# Patient Record
Sex: Female | Born: 1965 | Race: White | Hispanic: Yes | Marital: Married | Smoking: Never smoker
Health system: Southern US, Community
[De-identification: ages and names within clinical notes are randomized; demographics above are authoritative.]

## PROBLEM LIST (undated history)

## (undated) HISTORY — PX: CARPAL TUNNEL RELEASE: SHX101

---

## 2004-06-09 ENCOUNTER — Other Ambulatory Visit: Admission: RE | Admit: 2004-06-09 | Discharge: 2004-06-09 | Payer: Self-pay | Admitting: Obstetrics and Gynecology

## 2004-10-07 ENCOUNTER — Inpatient Hospital Stay (HOSPITAL_COMMUNITY): Admission: AD | Admit: 2004-10-07 | Discharge: 2004-10-10 | Payer: Self-pay | Admitting: Obstetrics and Gynecology

## 2004-11-17 ENCOUNTER — Other Ambulatory Visit: Admission: RE | Admit: 2004-11-17 | Discharge: 2004-11-17 | Payer: Self-pay | Admitting: Obstetrics and Gynecology

## 2006-02-19 ENCOUNTER — Other Ambulatory Visit: Admission: RE | Admit: 2006-02-19 | Discharge: 2006-02-19 | Payer: Self-pay | Admitting: Obstetrics and Gynecology

## 2006-06-12 ENCOUNTER — Inpatient Hospital Stay (HOSPITAL_COMMUNITY): Admission: AD | Admit: 2006-06-12 | Discharge: 2006-06-12 | Payer: Self-pay | Admitting: Obstetrics & Gynecology

## 2006-07-15 ENCOUNTER — Inpatient Hospital Stay (HOSPITAL_COMMUNITY): Admission: RE | Admit: 2006-07-15 | Discharge: 2006-07-17 | Payer: Self-pay | Admitting: Obstetrics & Gynecology

## 2018-10-05 ENCOUNTER — Other Ambulatory Visit: Payer: Self-pay

## 2018-10-05 ENCOUNTER — Encounter (HOSPITAL_COMMUNITY): Payer: Self-pay | Admitting: Obstetrics and Gynecology

## 2018-10-05 ENCOUNTER — Emergency Department (HOSPITAL_COMMUNITY): Payer: Self-pay

## 2018-10-05 ENCOUNTER — Emergency Department (HOSPITAL_COMMUNITY)
Admission: EM | Admit: 2018-10-05 | Discharge: 2018-10-05 | Disposition: A | Discharge status: Home / Self Care | Payer: Self-pay | Attending: Emergency Medicine | Admitting: Emergency Medicine

## 2018-10-05 DIAGNOSIS — R10817 Generalized abdominal tenderness: Secondary | ICD-10-CM | POA: Insufficient documentation

## 2018-10-05 DIAGNOSIS — R1010 Upper abdominal pain, unspecified: Secondary | ICD-10-CM

## 2018-10-05 DIAGNOSIS — R1011 Right upper quadrant pain: Secondary | ICD-10-CM | POA: Insufficient documentation

## 2018-10-05 LAB — COMPREHENSIVE METABOLIC PANEL
ALK PHOS: 74 U/L (ref 38–126)
ALT: 17 U/L (ref 0–44)
ANION GAP: 9 (ref 5–15)
AST: 20 U/L (ref 15–41)
Albumin: 4.3 g/dL (ref 3.5–5.0)
BILIRUBIN TOTAL: 0.6 mg/dL (ref 0.3–1.2)
BUN: 11 mg/dL (ref 6–20)
CALCIUM: 9.6 mg/dL (ref 8.9–10.3)
CO2: 26 mmol/L (ref 22–32)
CREATININE: 0.63 mg/dL (ref 0.44–1.00)
Chloride: 104 mmol/L (ref 98–111)
Glucose, Bld: 112 mg/dL — ABNORMAL HIGH (ref 70–99)
Potassium: 3.8 mmol/L (ref 3.5–5.1)
SODIUM: 139 mmol/L (ref 135–145)
TOTAL PROTEIN: 7.8 g/dL (ref 6.5–8.1)

## 2018-10-05 LAB — CBC
HCT: 38.6 % (ref 36.0–46.0)
Hemoglobin: 13 g/dL (ref 12.0–15.0)
MCH: 30.3 pg (ref 26.0–34.0)
MCHC: 33.7 g/dL (ref 30.0–36.0)
MCV: 90 fL (ref 80.0–100.0)
NRBC: 0 % (ref 0.0–0.2)
PLATELETS: 323 10*3/uL (ref 150–400)
RBC: 4.29 MIL/uL (ref 3.87–5.11)
RDW: 12.5 % (ref 11.5–15.5)
WBC: 8.6 10*3/uL (ref 4.0–10.5)

## 2018-10-05 LAB — I-STAT BETA HCG BLOOD, ED (MC, WL, AP ONLY)

## 2018-10-05 LAB — URINALYSIS, ROUTINE W REFLEX MICROSCOPIC
Bilirubin Urine: NEGATIVE
Glucose, UA: NEGATIVE mg/dL
HGB URINE DIPSTICK: NEGATIVE
KETONES UR: 20 mg/dL — AB
LEUKOCYTES UA: NEGATIVE
Nitrite: NEGATIVE
PROTEIN: NEGATIVE mg/dL
Specific Gravity, Urine: 1.019 (ref 1.005–1.030)
pH: 6 (ref 5.0–8.0)

## 2018-10-05 LAB — LIPASE, BLOOD: Lipase: 33 U/L (ref 11–51)

## 2018-10-05 MED ORDER — HYDROMORPHONE HCL 1 MG/ML IJ SOLN
0.5000 mg | Freq: Once | INTRAMUSCULAR | Status: AC
  Administered 2018-10-05: 0.5 mg via INTRAVENOUS
  Filled 2018-10-05: qty 1

## 2018-10-05 MED ORDER — DICYCLOMINE HCL 10 MG/ML IM SOLN
20.0000 mg | Freq: Once | INTRAMUSCULAR | Status: AC
  Administered 2018-10-05: 20 mg via INTRAMUSCULAR
  Filled 2018-10-05: qty 2

## 2018-10-05 MED ORDER — DICYCLOMINE HCL 20 MG PO TABS: 20.0000 mg | ORAL_TABLET | Freq: Two times a day (BID) | ORAL | 0 refills | Status: AC

## 2018-10-05 NOTE — ED Provider Notes (Signed)
[facility]-EMERGENCY DEPT Provider Note   CSN: [id number] Arrival date & time: 10/05/18  1815     History   Chief Complaint Chief Complaint  Patient presents with  . Abdominal Pain    HPI Brandi Crawford is a 52 y.o. female.  52 y/o female with no PMH presents to the ED with a chief complaint of abdominal pain x 6 months. Patient describes the pain as intermittent sharp mainly on the RUQ radiating to the middle of her abdomen. She reports the pain is worse with movement and after meals. Patient reports drinking tea for symptomatic relieve but reports no improvement. She has not tried any medication for symptoms. Patient states the symptoms worsen yesterday. She reports some constipation but had a bowel movement yesterday, no diarrhea. She denies any fever, nausea, vomiting, chest pain or urinary symptoms.      History reviewed. No pertinent past medical history.  There are no active problems to display for this patient.   Past Surgical History:  Procedure Laterality Date  . CARPAL TUNNEL RELEASE Left      OB History    Gravida      Para      Term      Preterm      AB      Living  2     SAB      TAB      Ectopic      Multiple      Live Births               Home Medications    Prior to Admission medications   Medication Sig Start Date End Date Taking? Authorizing Provider  Multiple Vitamin (MULTIVITAMIN WITH MINERALS) TABS tablet Take 1 tablet by mouth daily.   Yes [provider]  dicyclomine (BENTYL) 20 MG tablet Take 1 tablet (20 mg total) by mouth 2 (two) times daily for 14 days. 10/05/18 10/19/18  Claude MangesSoto, Jia Dottavio, PA-C    Family History No family history on file.  Social History Social History   Tobacco Use  . Smoking status: Never Smoker  Substance Use Topics  . Alcohol use: Not Currently  . Drug use: Not Currently     Allergies   Contrast media [iodinated diagnostic agents]   Review of  Systems Review of Systems  Constitutional: Negative for fever.  HENT: Negative for sore throat.   Respiratory: Negative for shortness of breath.   Cardiovascular: Negative for chest pain.  Gastrointestinal: Positive for abdominal pain. Negative for blood in stool, constipation, diarrhea, nausea and vomiting.  Genitourinary: Negative for dysuria, flank pain, hematuria, vaginal bleeding and vaginal discharge.  Skin: Negative for pallor and wound.  Neurological: Negative for light-headedness and headaches.     Physical Exam Updated Vital Signs BP (!) 148/76 (BP Location: Left Arm)   Pulse 61   Temp 98.8 F (37.1 C) (Oral)   Resp 17   LMP 10/05/2017   SpO2 100%   Physical Exam  Constitutional: She is oriented to person, place, and time. She appears well-developed and well-nourished.  HENT:  Head: Normocephalic and atraumatic.  Cardiovascular: Normal rate.  Pulmonary/Chest: Effort normal. No respiratory distress. She has no wheezes.  Abdominal: Soft. Bowel sounds are normal. There is generalized tenderness and tenderness in the right upper quadrant. There is guarding. There is no rigidity, no rebound and no CVA tenderness.  Neurological: She is alert and oriented to person, place, and time.  Skin: Skin is warm and dry.  Nursing note and vitals reviewed.    ED Treatments / Results  Labs (all labs ordered are listed, but only abnormal results are displayed) Labs Reviewed  COMPREHENSIVE METABOLIC PANEL - Abnormal; Notable for the following components:      Result Value   Glucose, Bld 112 (*)    All other components within normal limits  URINALYSIS, ROUTINE W REFLEX MICROSCOPIC - Abnormal; Notable for the following components:   APPearance HAZY (*)    Ketones, ur 20 (*)    All other components within normal limits  LIPASE, BLOOD  CBC  I-STAT BETA HCG BLOOD, ED ([facility], [facility], [facility] ONLY)    EKG None  Radiology US Abdomen Limited  Result Date: 10/05/2018 CLINICAL DATA:  RIGHT  upper quadrant pain with nausea and vomiting intermittently for 6 months EXAM: ULTRASOUND ABDOMEN LIMITED RIGHT UPPER QUADRANT COMPARISON:  None FINDINGS: Gallbladder: Shadowing 27 mm calculus within gallbladder, non mobile at lower gallbladder segment. Normal gallbladder wall thickness. No pericholecystic fluid. Sonographic Murphy sign present. Focus of ring down artifact seen from the anterior gallbladder wall question adenomyosis. Common bile duct: Diameter: 5 mm diameter, normal Liver: Normal echogenicity. No focal hepatic mass lesion or nodularity. Portal vein is patent on color Doppler imaging with normal direction of blood flow towards the liver. No RIGHT upper quadrant free fluid. IMPRESSION: 27 mm diameter non mobile calculus within gallbladder at the lower gallbladder segment. Patient demonstrated a sonographic Murphy sign with focal transducer pressure over the gallbladder though no gallbladder wall thickening or pericholecystic fluid are identified to support a sonographic diagnosis of acute cholecystitis; recommend correlation with clinical exam and laboratory data. No biliary dilatation or focal hepatic abnormalities. Electronically Signed   By: Ulyses Southward M.D.   On: 10/05/2018 20:10    Procedures Procedures (including critical care time)  Medications Ordered in ED Medications  dicyclomine (BENTYL) injection 20 mg (20 mg Intramuscular Given 10/05/18 1920)  HYDROmorphone (DILAUDID) injection 0.5 mg (0.5 mg Intravenous Given 10/05/18 2054)     Initial Impression / Assessment and Plan / ED Course  I have reviewed the triage vital signs and the nursing notes.  Pertinent labs & imaging results that were available during my care of the patient were reviewed by me and considered in my medical decision making (see chart for details).   Presents with upper abdominal pain x6 months.  Patient has not been seen or evaluated by any physician.  She has been taking tea for pain relief but states no  improvement in symptoms.  There is significant amount of right upper quadrant tenderness, will order right upper quadrant ultrasound to rule out any gallbladder pathology.  BMP showed no electrolyte abnormality, creatinine stable, LFTs are within normal limits, bilirubin is normal at 0.6.  CBC showed no leukocytosis, hemoglobin is also stable.  Suspicion for appendicitis, as patient has been tolerating food no nausea and is afebrile during visit.  UA showed no nitrite, leukocytes, white blood cell count patient denies urinary symptoms at this time. RUQ US showed: 27 mm diameter non mobile calculus within gallbladder at the lower  gallbladder segment.    Patient demonstrated a sonographic Murphy sign with focal transducer  pressure over the gallbladder though no gallbladder wall thickening  or pericholecystic fluid are identified to support a sonographic  diagnosis of acute cholecystitis; recommend correlation with  clinical exam and laboratory data.    No biliary dilatation or focal hepatic abnormalities.   Patient reports her pain has improved with bentyl and point  5 of dilaudid. Will have patient follow up with PCP, currently does not have a primary care physician will give a referral to the [facility] health and wellness clinic.  We will also provide her with some Bentyl prescription to help with her pain.  Is advised to return to the ED if she experiences any fever, chills, worsening symptoms.  Vitals stable during ED visit, patient is ambulatory and well-appearing.  Patient stable for discharge.    Final Clinical Impressions(s) / ED Diagnoses   Final diagnoses:  Pain of upper abdomen    ED Discharge Orders         Ordered    dicyclomine (BENTYL) 20 MG tablet  2 times daily     10/05/18 2116           Claude Manges, PA-C 10/05/18 2119    Pricilla Loveless, MD 10/05/18 [phone]

## 2018-10-05 NOTE — ED Notes (Signed)
US at bedside

## 2018-10-05 NOTE — ED Triage Notes (Signed)
Pt reports stomach pain that has been increasingly worse today. Pt reports nothing makes the pain better or worse and she had 1 episode of emesis. Patient denies N/D.

## 2018-10-05 NOTE — Discharge Instructions (Addendum)
Le he dado [place] receta con medicina para Chief Technology Officerel dolor, porfavor tome ConAgra Foodsuna pastilla dos veces al dia por los siguientes 7 dias. Tambien le he dado Environmental education officerel numero de [facility] porfavor hago una cita con ellos para conseguir un doctor primario. Si tiene fiebre, vomito o sus simptomas empeoran regrese a Radio broadcast assistantla sala de emergencia.

## 2020-05-11 IMAGING — US US ABDOMEN LIMITED
1 series · 13 of 25 positions shown · non-contrast
Comparison: None

CLINICAL DATA: RIGHT upper quadrant pain with nausea and vomiting
intermittently for 6 months

EXAM:
ULTRASOUND ABDOMEN LIMITED RIGHT UPPER QUADRANT

[Series 1: us abdomen limited · 13 of 52 slices shown]
[im 1/52]
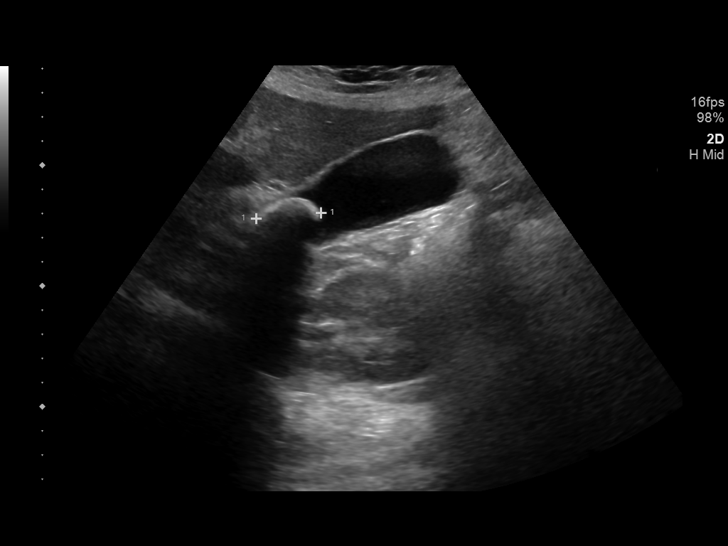
[im 5/52]
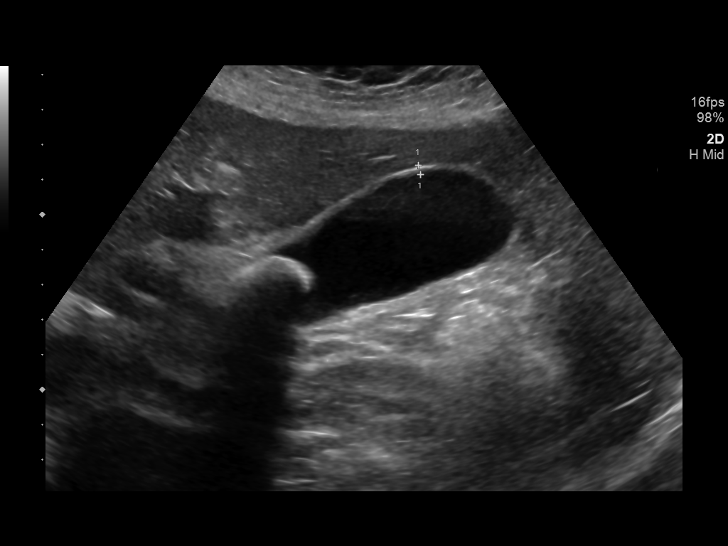
[im 9/52]
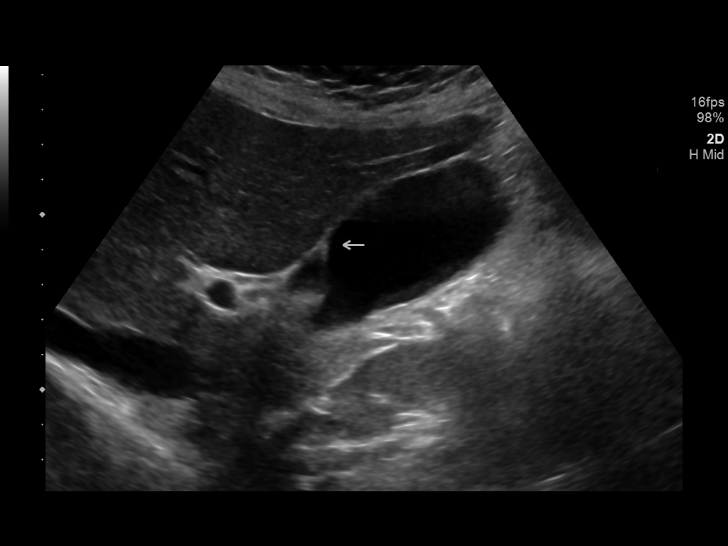
[im 13/52]
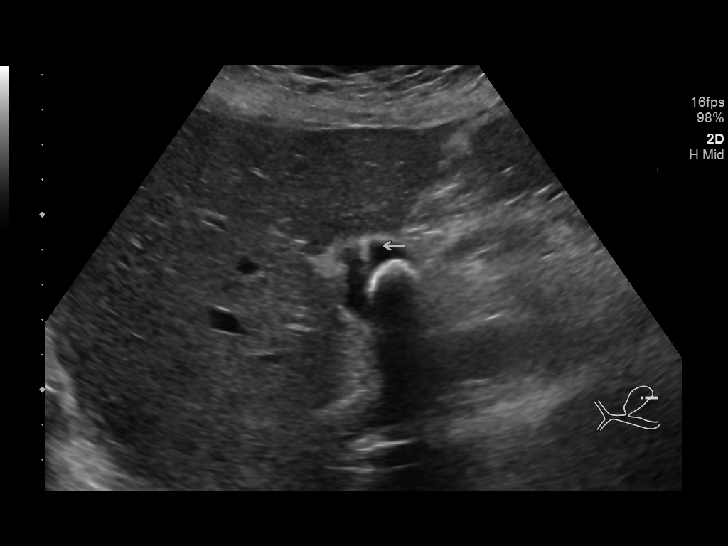
[im 18/52]
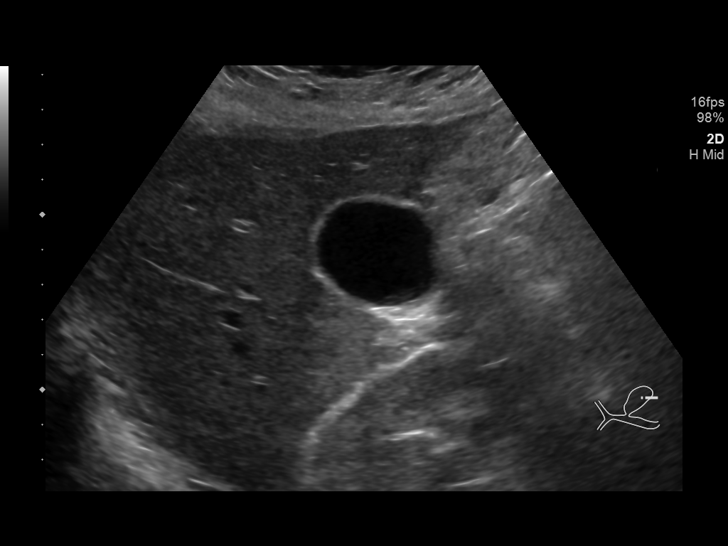
[im 22/52]
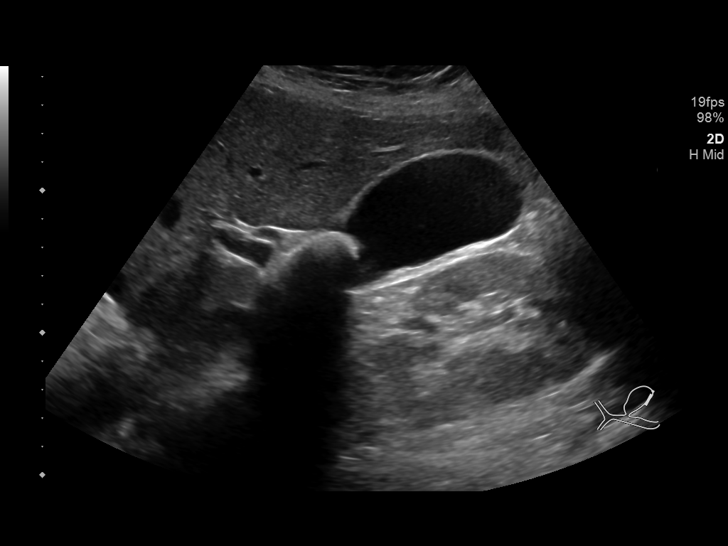
[im 26/52]
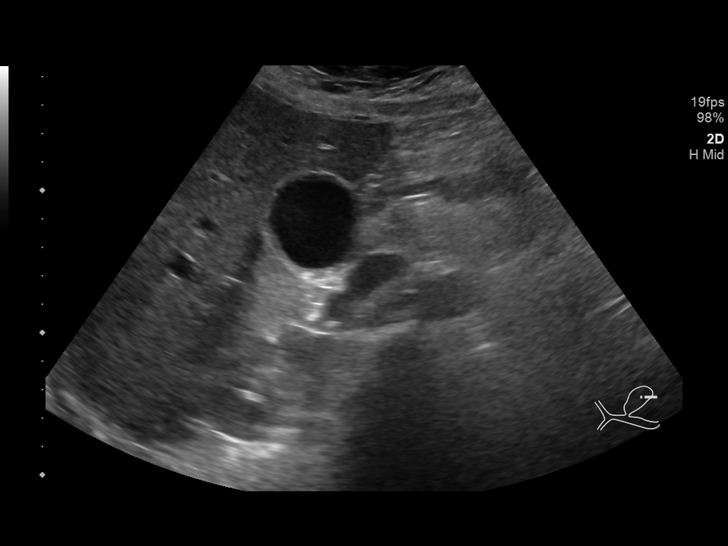
[im 30/52]
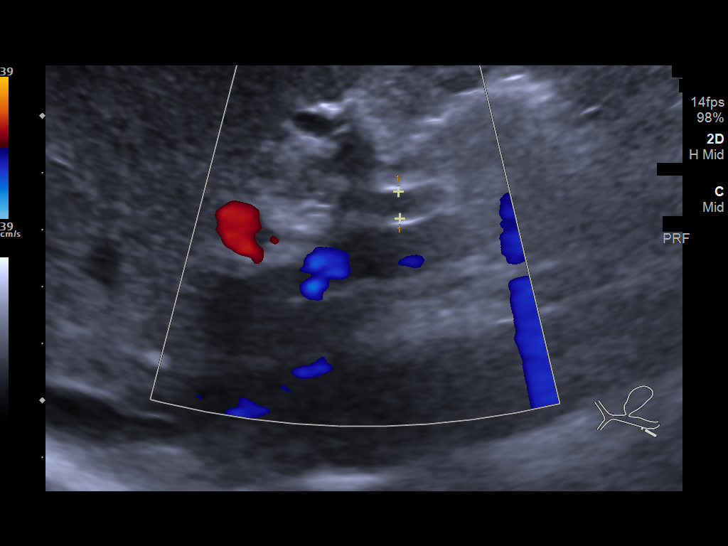
[im 35/52]
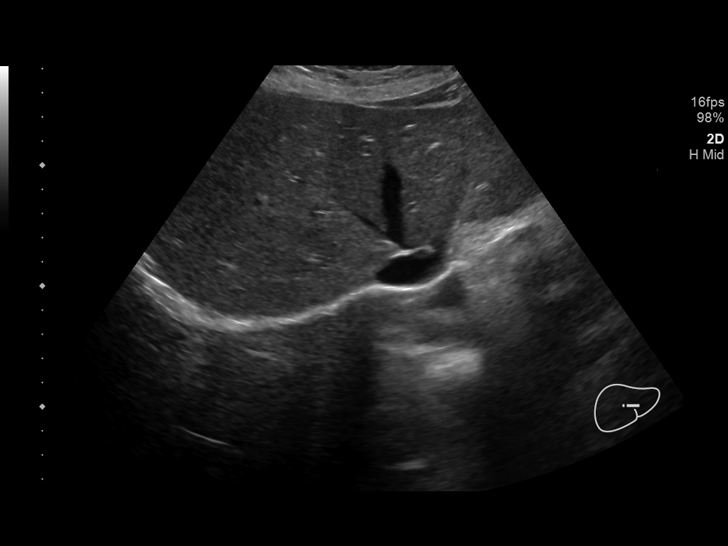
[im 39/52]
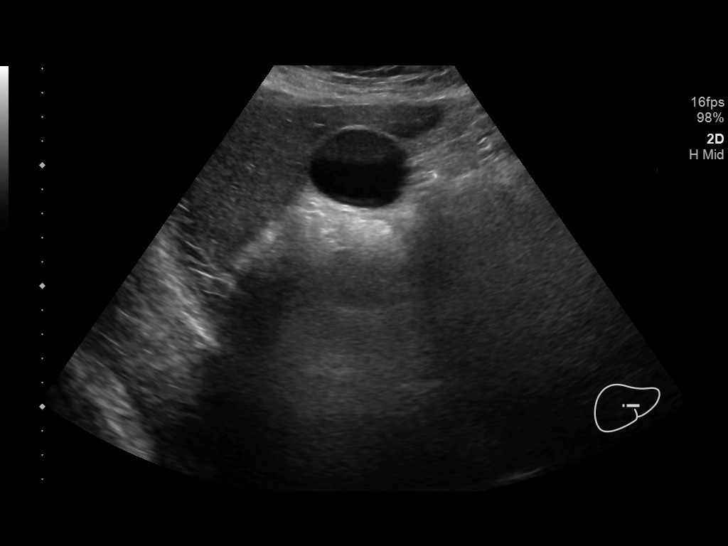
[im 43/52]
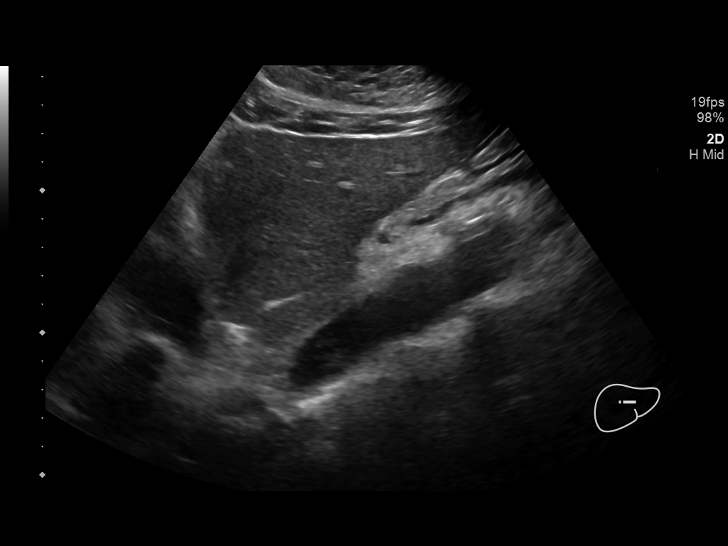
[im 47/52]
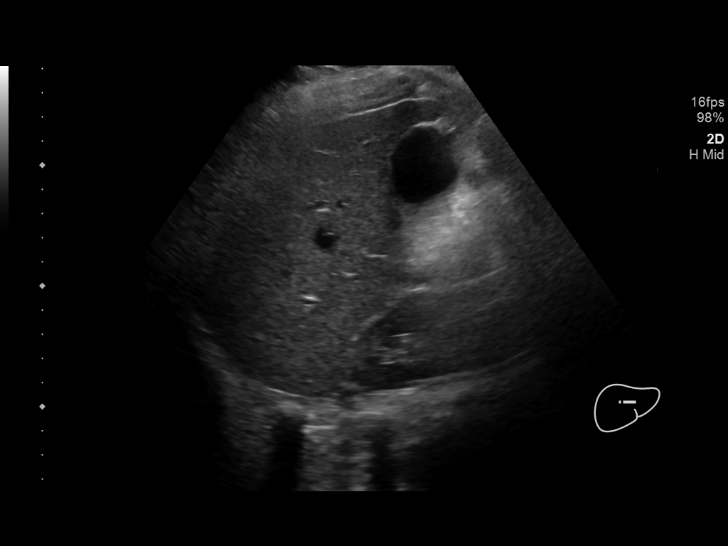
[im 52/52]
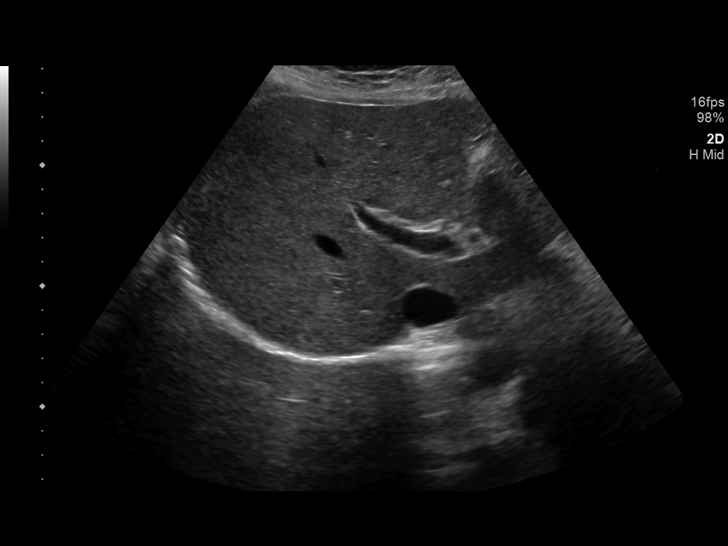

[13 of 25 positions shown; findings below may reference images not displayed]

FINDINGS: Gallbladder:

Shadowing 27 mm calculus within gallbladder, non mobile at lower
gallbladder segment. Normal gallbladder wall thickness. No
pericholecystic fluid. Sonographic Murphy sign present. Focus of
ring down artifact seen from the anterior gallbladder wall question
adenomyosis.

Common bile duct:

Diameter: 5 mm diameter, normal

Liver:

Normal echogenicity. No focal hepatic mass lesion or nodularity.
Portal vein is patent on color Doppler imaging with normal direction
of blood flow towards the liver.

No RIGHT upper quadrant free fluid.
IMPRESSION: 27 mm diameter non mobile calculus within gallbladder at the lower
gallbladder segment.

Patient demonstrated a sonographic Murphy sign with focal transducer
pressure over the gallbladder though no gallbladder wall thickening
or pericholecystic fluid are identified to support a sonographic
diagnosis of acute cholecystitis; recommend correlation with
clinical exam and laboratory data.

No biliary dilatation or focal hepatic abnormalities.

## 2021-03-21 ENCOUNTER — Ambulatory Visit: Payer: Self-pay | Admitting: Medical

## 2021-06-04 ENCOUNTER — Encounter: Payer: Self-pay | Admitting: Medical

## 2021-06-04 ENCOUNTER — Ambulatory Visit (INDEPENDENT_AMBULATORY_CARE_PROVIDER_SITE_OTHER): Payer: 59 | Admitting: Medical

## 2021-06-04 ENCOUNTER — Other Ambulatory Visit: Payer: Self-pay

## 2021-06-04 VITALS — BP 125/72 | HR 77 | Resp 18 | Ht 63.0 in | Wt 188.8 lb

## 2021-06-04 DIAGNOSIS — Z113 Encounter for screening for infections with a predominantly sexual mode of transmission: Secondary | ICD-10-CM

## 2021-06-04 DIAGNOSIS — Z0000 Encounter for general adult medical examination without abnormal findings: Secondary | ICD-10-CM | POA: Diagnosis not present

## 2021-06-04 DIAGNOSIS — Z1211 Encounter for screening for malignant neoplasm of colon: Secondary | ICD-10-CM | POA: Diagnosis not present

## 2021-06-04 DIAGNOSIS — Z1231 Encounter for screening mammogram for malignant neoplasm of breast: Secondary | ICD-10-CM | POA: Diagnosis not present

## 2021-06-04 NOTE — Patient Instructions (Addendum)
For you wellness exam today I have ordered cbc, cmp, lipid panel  and hiv.  Declined vaccines today.  Recommend exercise and healthy diet.  We will let you know lab results as they come in.  Follow up date appointment will be determined after lab review.    Refer to GI for colonoscopy.  Referral for mammogram placed. Explained process.    Cuidados preventivos en las mujeres de 55 a 83 aos de edad Preventive Care 18-53 Years Old, Female Los cuidados preventivos hacen referencia a las opciones en cuanto al estilo de vida y a las visitas al mdico, las cuales pueden promover la salud y Musician. Esto puede comprender lo siguiente: Un examen fsico anual. Esto tambin se conoce como visita de control de bienestar anual. Exmenes dentales y oculares de [place] regular. Vacunas. Estudios para Health and safety inspector. Elecciones para un estilo de vida saludable, por ejemplo: Seguir una dieta saludable. Practicar actividad fsica con regularidad. No consumir drogas ni productos que contengan nicotina y tabaco. Limitar el consumo de bebidas alcohlicas. Qu puedo esperar para mi visita de cuidado preventivo? Examen fsico El mdico revisar lo siguiente: IT consultant y [place]. Estos pueden usarse para calcular el IMC (ndice de masa corporal). El [facility] es una medicin que indica si tiene un peso saludable. Frecuencia cardaca y presin arterial. Temperatura corporal. Piel para detectar manchas anormales. Asesoramiento Su mdico puede preguntarle acerca de: Problemas mdicos pasados. Antecedentes mdicos familiares. Consumo de tabaco, alcohol y drogas. Su bienestar emocional. Restaurant manager, fast food y las relaciones personales. Su actividad sexual. Hbitos de alimentacin, ejercicio y sueo. Su trabajo y [place] laboral. Acceso a armas de fuego. Mtodos anticonceptivos. Ciclo menstrual. Antecedentes de embarazo. Qu vacunas necesito?  Las vacunas se aplican a varias edades,  segn un calendario. El [employer] recomendar vacunas segn su edad, sus antecedentes mdicos, su estilo de viday otros factores, como los viajes o el lugar donde trabaja. Qu pruebas necesito? Anlisis de [place] de lpidos y colesterol. Estos se pueden verificar cada 5 aos, o ms a menudo, si usted tiene ms de 32 aos de edad. Anlisis de hepatitis C. Anlisis de hepatitis B. Pruebas de deteccin Pruebas de deteccin de cncer de pulmn. Es posible que se le realice esta prueba de deteccin a partir de los 35 aos de edad, si ha fumado durante 30 aos un paquete diario y sigue fumando o dej el hbito en algn momento en los ltimos 15 aos. Pruebas de Programme researcher, broadcasting/film/video de Surveyor, minerals. Todos los adultos a partir de los 66 aos de edad y [place] 10 aos de edad deben hacerse esta prueba de deteccin. El mdico puede recomendarle las pruebas de deteccin a partir de los 13 aos de edad si corre un mayor riesgo. Le realizarn pruebas cada 1 a 10 aos, segn los [place] y el tipo de prueba de Programme researcher, broadcasting/film/video. Pruebas de deteccin de la diabetes. Esto se Set designer un control del azcar en la sangre (glucosa) despus de no haber comido durante un periodo de tiempo (ayuno). Es posible que se le realice esta prueba cada 1 a 3 aos. Mamografa. Se puede realizar cada 1 o 2 aos. Hable con su mdico sobre cundo debe comenzar a Engineer, manufacturing de [place] regular. Esto depende de si tiene antecedentes familiares de cncer de mama o no. Pruebas de deteccin de cncer relacionado con las mutaciones del BRCA. Es posible que se las deba realizar si tiene antecedentes de cncer de mama, de ovario, de trompas o peritoneal. Examen  plvico y prueba de Papanicolaou. Esto se puede Optometrist cada 3 aos a partir de los 21 aos de [place]. A partir de los 30 aos, esto se puede Optometrist cada 5 aos si usted se realiza una prueba de Papanicolaou en combinacin con una prueba de deteccin del virus  del papiloma humano (VPH). Otras pruebas Pruebas de enfermedades de transmisin sexual (ETS), si est en riesgo. Densitometra sea. Esto se realiza para detectar osteoporosis. Se le puede realizar este examen de deteccin si tiene un riesgo alto de tener osteoporosis. Hable con su mdico [employer], las opciones detratamiento y, si corresponde, la necesidad de Optometrist ms pruebas. Siga estas instrucciones en su casa: Comida y bebida  Siga una dieta que incluya frutas y verduras frescas, cereales integrales, protenas magras y productos lcteos descremados. Tome los suplementos vitamnicos y [employer] se lo haya indicado el mdico. No beba alcohol si: Su mdico le indica no hacerlo. Est embarazada, puede estar embarazada o est tratando de [place]. Si bebe alcohol: Limite la cantidad que consume de 0 a 1 medida por da. Est atenta a la cantidad de alcohol que hay en las bebidas que toma. En los [place], una medida equivale a una botella de cerveza de 12 oz (355 ml), un vaso de vino de 5 oz (148 ml) o un vaso de una bebida alcohlica de alta graduacin de 1 oz (44 ml).  Estilo de [employer] y las encas a diario. Cepllese los dientes a la maana y a la noche con pasta dental con fluoruro. Use hilo dental una vez al da. Mantngase activa. Haga al menos 30 minutos de ejercicio, 5 o ms das cada semana. No consuma ningn producto que contenga nicotina o tabaco, como cigarrillos, cigarrillos electrnicos y tabaco de Higher education careers adviser. Si necesita ayuda para dejar de fumar, consulte al mdico. No consuma drogas. Si es sexualmente activa, practique sexo seguro. Use un condn u otra forma de proteccin para prevenir las ITS (infecciones de transmisin sexual). Si no desea quedar embarazada, use un mtodo anticonceptivo. Si busca un embarazo, realice una consulta previa al [employer] con el mdico. Si el mdico se lo indic, tome una dosis baja de  aspirina diariamente a partir de los 43 aos de [place]. Encuentre formas saludables de lidiar con el estrs tales como: Meditacin, yoga o Conservation officer, nature. Lleve un diario personal. Hable con una persona confiable. Pase tiempo con amigos y familiares. Seguridad [place] siempre el cinturn de seguridad al conducir o viajar en un vehculo. No conduzca: Si ha estado bebiendo alcohol. No viaje con un conductor que ha estado bebiendo. Si est cansada o distrada. Mientras est enviando mensajes de texto. Use un casco y otros equipos de proteccin [place] deportivas. Si tiene armas de fuego en su casa, asegrese de seguir todos los procedimientos de seguridad correspondientes. Cundo volver? Visite al mdico una vez al ao para una visita anual de control de bienestar. Pregntele al mdico con qu frecuencia debe realizarse un control de la vista y los dientes. Mantenga su esquema de vacunacin al da. Esta informacin no tiene Marine scientist el consejo del mdico. Asegresede hacerle al mdico cualquier pregunta que tenga. Document Revised: 08/28/2020 Document Reviewed: 08/28/2020 Elsevier Patient Education  2022 [employer].

## 2021-06-04 NOTE — Progress Notes (Signed)
Subjective:    Patient ID: Brandi Crawford, female    DOB: July 14, 1966, 55 y.o.   MRN: [id number]  HPI  Pt in for first time.  Pt went to clinic and never had pcp.   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 States moderate healthy diet. 2-3 cups of coffee a day. Occasional soda rare. Non smoker. No alcohol use.  Pt states last wellness exam about 2-3 years.   Pt never had covid vaccine. But states had a lot of symptoms first year of pandemic.   Pt never had colonoscopy.   Pt had last pap one year ago and was normal.     Review of Systems  Constitutional:  Negative for chills, fatigue and fever.  HENT:  Negative for congestion and ear discharge.   Respiratory:  Negative for cough, chest tightness and wheezing.   Cardiovascular:  Negative for chest pain and palpitations.  Gastrointestinal:  Negative for abdominal pain.  Endocrine: Negative for polydipsia, polyphagia and polyuria.  Genitourinary:  Negative for dysuria, frequency and hematuria.  Musculoskeletal:  Negative for back pain, myalgias and neck pain.  Skin:  Negative for rash.  Psychiatric/Behavioral:  Negative for behavioral problems, decreased concentration, dysphoric mood, sleep disturbance and suicidal ideas.     No past medical history on file.   Social History   Socioeconomic History   Marital status: Married    Spouse name: Not on file   Number of children: Not on file   Years of education: Not on file   Highest education level: Not on file  Occupational History   Not on file  Tobacco Use   Smoking status: Never   Smokeless tobacco: Not on file  Substance and Sexual Activity   Alcohol use: Not Currently   Drug use: Not Currently   Sexual activity: Yes  Other Topics Concern   Not on file  Social History Narrative   Not on file   Social Determinants of Health   Financial Resource Strain: Not on file  Food Insecurity: Not on file  Transportation Needs: Not on file  Physical  Activity: Not on file  Stress: Not on file  Social Connections: Not on file  Intimate Partner Violence: Not on file    Past Surgical History:  Procedure Laterality Date   CARPAL TUNNEL RELEASE Left     No family history on file.  Allergies  Allergen Reactions   Contrast Media [Iodinated Diagnostic Agents] Itching    Current Outpatient Medications on File Prior to Visit  Medication Sig Dispense Refill   Multiple Vitamin (MULTIVITAMIN WITH MINERALS) TABS tablet Take 1 tablet by mouth daily.     dicyclomine (BENTYL) 20 MG tablet Take 1 tablet (20 mg total) by mouth 2 (two) times daily for 14 days. 28 tablet 0   No current facility-administered medications on file prior to visit.    BP 125/72   Pulse 77   Resp 18   Ht 5\' 3"  (1.6 m)   Wt 188 lb 12.8 oz (85.6 kg)   LMP 10/05/2017   SpO2 98%   BMI 33.44 kg/m       Objective:   Physical Exam  General Mental Status- Alert. General Appearance- Not in acute distress.   Skin General: Color- Normal Color. Moisture- Normal Moisture.  Neck Carotid Arteries- Normal color. Moisture- Normal Moisture. No carotid bruits. No JVD.  Chest and Lung Exam Auscultation: Breath Sounds:-Normal.  Cardiovascular Auscultation:Rythm- Regular. Murmurs & Other Heart Sounds:Auscultation of the heart reveals-  No Murmurs.  Abdomen Inspection:-Inspeection Normal. Palpation/Percussion:Note:No mass. Palpation and Percussion of the abdomen reveal- Non Tender, Non Distended + BS, no rebound or guarding.  Neurologic Cranial Nerve exam:- CN III-XII intact(No nystagmus), symmetric smile. Strength:- 5/5 equal and symmetric strength both upper and lower extremities.       Assessment & Plan:   For you wellness exam today I have ordered cbc, cmp, lipid panel  and hiv.  Declined vaccines today.  Recommend exercise and healthy diet.  We will let you know lab results as they come in.  Follow up date appointment will be determined after lab  review.    Refer to GI for colonoscopy.  Referral for mammogram placed. Explained process.  Esperanza Richters, PA-C

## 2021-07-04 ENCOUNTER — Other Ambulatory Visit: Payer: Self-pay

## 2021-07-04 ENCOUNTER — Other Ambulatory Visit (INDEPENDENT_AMBULATORY_CARE_PROVIDER_SITE_OTHER): Payer: 59

## 2021-07-04 ENCOUNTER — Telehealth: Payer: Self-pay | Admitting: Medical

## 2021-07-04 DIAGNOSIS — Z113 Encounter for screening for infections with a predominantly sexual mode of transmission: Secondary | ICD-10-CM

## 2021-07-04 DIAGNOSIS — Z1231 Encounter for screening mammogram for malignant neoplasm of breast: Secondary | ICD-10-CM

## 2021-07-04 DIAGNOSIS — Z0000 Encounter for general adult medical examination without abnormal findings: Secondary | ICD-10-CM | POA: Diagnosis not present

## 2021-07-04 LAB — COMPREHENSIVE METABOLIC PANEL
ALT: 15 U/L (ref 0–35)
AST: 16 U/L (ref 0–37)
Albumin: 4 g/dL (ref 3.5–5.2)
Alkaline Phosphatase: 82 U/L (ref 39–117)
BUN: 12 mg/dL (ref 6–23)
CO2: 26 mEq/L (ref 19–32)
Calcium: 9.2 mg/dL (ref 8.4–10.5)
Chloride: 104 mEq/L (ref 96–112)
Creatinine, Ser: 0.6 mg/dL (ref 0.40–1.20)
GFR: 102.84 mL/min (ref >60.00)
Glucose, Bld: 90 mg/dL (ref 70–99)
Potassium: 4.5 mEq/L (ref 3.5–5.1)
Sodium: 138 mEq/L (ref 135–145)
Total Bilirubin: 0.4 mg/dL (ref 0.2–1.2)
Total Protein: 6.9 g/dL (ref 6.0–8.3)

## 2021-07-04 LAB — CBC WITH DIFFERENTIAL/PLATELET
Basophils Absolute: 0 10*3/uL (ref 0.0–0.1)
Basophils Relative: 0.5 % (ref 0.0–3.0)
Eosinophils Absolute: 0.1 10*3/uL (ref 0.0–0.7)
Eosinophils Relative: 1.6 % (ref 0.0–5.0)
HCT: 36.9 % (ref 36.0–46.0)
Hemoglobin: 12.5 g/dL (ref 12.0–15.0)
Lymphocytes Relative: 29.8 % (ref 12.0–46.0)
Lymphs Abs: 1.8 10*3/uL (ref 0.7–4.0)
MCHC: 34 g/dL (ref 30.0–36.0)
MCV: 86.9 fl (ref 78.0–100.0)
Monocytes Absolute: 0.4 10*3/uL (ref 0.1–1.0)
Monocytes Relative: 6.5 % (ref 3.0–12.0)
Neutro Abs: 3.8 10*3/uL (ref 1.4–7.7)
Neutrophils Relative %: 61.6 % (ref 43.0–77.0)
Platelets: 312 10*3/uL (ref 150.0–400.0)
RBC: 4.24 Mil/uL (ref 3.87–5.11)
RDW: 13 % (ref 11.5–15.5)
WBC: 6.2 10*3/uL (ref 4.0–10.5)

## 2021-07-04 LAB — LIPID PANEL
Cholesterol: 199 mg/dL (ref 0–200)
HDL: 42.9 mg/dL (ref >39.00)
LDL Cholesterol: 136 mg/dL — ABNORMAL HIGH (ref 0–99)
NonHDL: 155.8
Total CHOL/HDL Ratio: 5
Triglycerides: 99 mg/dL (ref 0.0–149.0)
VLDL: 19.8 mg/dL (ref 0.0–40.0)

## 2021-07-04 NOTE — Telephone Encounter (Signed)
Pt stated during her last visit asked provider to put an order for her to get a mammogram - please put order in for a mammogram so pt can schedule an appt. Please inform pt when order in at tel [phone]. Please advise.

## 2021-07-04 NOTE — Addendum Note (Signed)
Addended by: Gwenevere Abbot on: 07/04/2021 05:16 PM   Modules accepted: Orders

## 2021-07-06 LAB — HIV ANTIBODY (ROUTINE TESTING W REFLEX): HIV 1&2 Ab, 4th Generation: NONREACTIVE

## 2021-07-07 NOTE — Progress Notes (Signed)
Called but no answer and voice mail was full ?

## 2021-07-08 NOTE — Telephone Encounter (Signed)
Called pt could not leave message (mail box full) - wanted to inform pt mammogram order in and pt can schedule her mammogram appt.

## 2021-07-08 NOTE — Progress Notes (Signed)
Called a few times but no answer and mailbox was full.

## 2021-08-22 ENCOUNTER — Ambulatory Visit: Payer: 59 | Admitting: Family

## 2021-08-22 ENCOUNTER — Other Ambulatory Visit: Payer: Self-pay

## 2021-08-25 ENCOUNTER — Ambulatory Visit: Payer: 59 | Admitting: Medical

## 2021-09-09 ENCOUNTER — Ambulatory Visit (HOSPITAL_COMMUNITY)
Admission: EM | Admit: 2021-09-09 | Discharge: 2021-09-10 | Disposition: A | Discharge status: Home / Self Care | Payer: 59 | Attending: Surgery | Admitting: Surgery

## 2021-09-09 ENCOUNTER — Other Ambulatory Visit: Payer: Self-pay

## 2021-09-09 ENCOUNTER — Emergency Department (HOSPITAL_COMMUNITY): Payer: 59

## 2021-09-09 DIAGNOSIS — Z20822 Contact with and (suspected) exposure to covid-19: Secondary | ICD-10-CM | POA: Diagnosis not present

## 2021-09-09 DIAGNOSIS — Z91041 Radiographic dye allergy status: Secondary | ICD-10-CM | POA: Insufficient documentation

## 2021-09-09 DIAGNOSIS — R109 Unspecified abdominal pain: Secondary | ICD-10-CM

## 2021-09-09 DIAGNOSIS — R1011 Right upper quadrant pain: Secondary | ICD-10-CM | POA: Diagnosis present

## 2021-09-09 DIAGNOSIS — R1114 Bilious vomiting: Secondary | ICD-10-CM

## 2021-09-09 DIAGNOSIS — E860 Dehydration: Secondary | ICD-10-CM | POA: Diagnosis not present

## 2021-09-09 DIAGNOSIS — K8020 Calculus of gallbladder without cholecystitis without obstruction: Secondary | ICD-10-CM

## 2021-09-09 DIAGNOSIS — K8012 Calculus of gallbladder with acute and chronic cholecystitis without obstruction: Secondary | ICD-10-CM | POA: Diagnosis not present

## 2021-09-09 LAB — COMPREHENSIVE METABOLIC PANEL
ALT: 16 U/L (ref 0–44)
AST: 21 U/L (ref 15–41)
Albumin: 4.2 g/dL (ref 3.5–5.0)
Alkaline Phosphatase: 86 U/L (ref 38–126)
Anion gap: 7 (ref 5–15)
BUN: 7 mg/dL (ref 6–20)
CO2: 26 mmol/L (ref 22–32)
Calcium: 9.4 mg/dL (ref 8.9–10.3)
Chloride: 104 mmol/L (ref 98–111)
Creatinine, Ser: 0.55 mg/dL (ref 0.44–1.00)
GFR, Estimated: >60 mL/min (ref >60)
Glucose, Bld: 112 mg/dL — ABNORMAL HIGH (ref 70–99)
Potassium: 4 mmol/L (ref 3.5–5.1)
Sodium: 137 mmol/L (ref 135–145)
Total Bilirubin: 0.4 mg/dL (ref 0.3–1.2)
Total Protein: 7.7 g/dL (ref 6.5–8.1)

## 2021-09-09 LAB — CBC WITH DIFFERENTIAL/PLATELET
Abs Immature Granulocytes: 0.06 10*3/uL (ref 0.00–0.07)
Basophils Absolute: 0 10*3/uL (ref 0.0–0.1)
Basophils Relative: 0 %
Eosinophils Absolute: 0 10*3/uL (ref 0.0–0.5)
Eosinophils Relative: 0 %
HCT: 38.3 % (ref 36.0–46.0)
Hemoglobin: 12.7 g/dL (ref 12.0–15.0)
Immature Granulocytes: 1 %
Lymphocytes Relative: 12 %
Lymphs Abs: 1.5 10*3/uL (ref 0.7–4.0)
MCH: 29.5 pg (ref 26.0–34.0)
MCHC: 33.2 g/dL (ref 30.0–36.0)
MCV: 88.9 fL (ref 80.0–100.0)
Monocytes Absolute: 0.5 10*3/uL (ref 0.1–1.0)
Monocytes Relative: 4 %
Neutro Abs: 10.6 10*3/uL — ABNORMAL HIGH (ref 1.7–7.7)
Neutrophils Relative %: 83 %
Platelets: 343 10*3/uL (ref 150–400)
RBC: 4.31 MIL/uL (ref 3.87–5.11)
RDW: 12.6 % (ref 11.5–15.5)
WBC: 12.7 10*3/uL — ABNORMAL HIGH (ref 4.0–10.5)
nRBC: 0 % (ref 0.0–0.2)

## 2021-09-09 LAB — I-STAT BETA HCG BLOOD, ED (MC, WL, AP ONLY): I-stat hCG, quantitative: <5 m[IU]/mL (ref <5)

## 2021-09-09 LAB — URINALYSIS, ROUTINE W REFLEX MICROSCOPIC
Bilirubin Urine: NEGATIVE
Glucose, UA: NEGATIVE mg/dL
Hgb urine dipstick: NEGATIVE
Ketones, ur: 80 mg/dL — AB
Leukocytes,Ua: NEGATIVE
Nitrite: NEGATIVE
Protein, ur: NEGATIVE mg/dL
Specific Gravity, Urine: 1.018 (ref 1.005–1.030)
pH: 7 (ref 5.0–8.0)

## 2021-09-09 LAB — LIPASE, BLOOD: Lipase: 24 U/L (ref 11–51)

## 2021-09-09 MED ORDER — MORPHINE SULFATE (PF) 4 MG/ML IV SOLN
4.0000 mg | Freq: Once | INTRAVENOUS | Status: AC
  Administered 2021-09-09: 4 mg via INTRAMUSCULAR
  Filled 2021-09-09: qty 1

## 2021-09-09 MED ORDER — ONDANSETRON 8 MG PO TBDP
8.0000 mg | ORAL_TABLET | Freq: Once | ORAL | Status: AC
  Administered 2021-09-09: 8 mg via ORAL
  Filled 2021-09-09: qty 1

## 2021-09-09 NOTE — ED Provider Notes (Signed)
Emergency Medicine Provider Triage Evaluation Note  Brandi Crawford , a 55 y.o. female  was evaluated in triage.  Pt complains of right upper quadrant pain that began yesterday, exacerbated with food.  Prior episodes similar to this, where she was told she had stones in her gallbladder.  She did not have this removed.  Not taking any medication for improvement in her symptoms due to being afraid that this will likely exacerbate the pain.  N.p.o. since last night.  Review of Systems  Positive: Right upper quadrant pain Negative: Fever, diarrhea  Physical Exam  BP (!) 152/59 (BP Location: Right Arm)   Pulse (!) 58   Temp 98.8 F (37.1 C) (Oral)   Resp 15   Ht 5\' 3"  (1.6 m)   Wt 85.3 kg   LMP 10/05/2017   SpO2 99%   BMI 33.30 kg/m  Gen:   Awake, no distress   Resp:  Normal effort  MSK:   Moves extremities without difficulty  Other:    Medical Decision Making  Medically screening exam initiated at 7:26 PM.  Appropriate orders placed.  Brandi Crawford was informed that the remainder of the evaluation will be completed by another provider, this initial triage assessment does not replace that evaluation, and the importance of remaining in the ED until their evaluation is complete.  Patient here with right upper quadrant pain that began yesterday, exacerbated with food.  Prior history of cholelithiasis, never had cholecystectomy.  No medication trial prior to arrival.   Brandi Crawford, Claude Manges 09/09/21 13/01/22, MD 09/09/21 [id number]

## 2021-09-09 NOTE — ED Notes (Signed)
US at bedside

## 2021-09-09 NOTE — ED Triage Notes (Signed)
Pt has sharp RUQ pain since 1100 this morning.

## 2021-09-10 ENCOUNTER — Encounter (HOSPITAL_COMMUNITY): Payer: Self-pay | Admitting: *Deleted

## 2021-09-10 ENCOUNTER — Encounter (HOSPITAL_COMMUNITY)
Admission: EM | Disposition: A | Discharge status: Home / Self Care | Payer: Self-pay | Source: Home / Self Care | Attending: Emergency Medicine

## 2021-09-10 ENCOUNTER — Telehealth: Payer: Self-pay | Admitting: Medical

## 2021-09-10 ENCOUNTER — Emergency Department (HOSPITAL_COMMUNITY): Payer: 59 | Admitting: Certified Registered Nurse Anesthetist

## 2021-09-10 ENCOUNTER — Ambulatory Visit: Admit: 2021-09-10 | Payer: 59 | Admitting: Surgery

## 2021-09-10 DIAGNOSIS — K8012 Calculus of gallbladder with acute and chronic cholecystitis without obstruction: Secondary | ICD-10-CM | POA: Diagnosis not present

## 2021-09-10 DIAGNOSIS — Z20822 Contact with and (suspected) exposure to covid-19: Secondary | ICD-10-CM | POA: Diagnosis not present

## 2021-09-10 DIAGNOSIS — Z91041 Radiographic dye allergy status: Secondary | ICD-10-CM | POA: Diagnosis not present

## 2021-09-10 DIAGNOSIS — E860 Dehydration: Secondary | ICD-10-CM | POA: Diagnosis not present

## 2021-09-10 DIAGNOSIS — K8070 Calculus of gallbladder and bile duct without cholecystitis without obstruction: Secondary | ICD-10-CM

## 2021-09-10 HISTORY — PX: CHOLECYSTECTOMY: SHX55

## 2021-09-10 LAB — RESP PANEL BY RT-PCR (FLU A&B, COVID) ARPGX2
Influenza A by PCR: NEGATIVE
Influenza B by PCR: NEGATIVE
SARS Coronavirus 2 by RT PCR: NEGATIVE

## 2021-09-10 SURGERY — LAPAROSCOPIC CHOLECYSTECTOMY WITH INTRAOPERATIVE CHOLANGIOGRAM
Anesthesia: General | Site: Abdomen

## 2021-09-10 MED ORDER — FENTANYL CITRATE (PF) 100 MCG/2ML IJ SOLN
INTRAMUSCULAR | Status: AC
  Filled 2021-09-10: qty 2

## 2021-09-10 MED ORDER — LIDOCAINE 2% (20 MG/ML) 5 ML SYRINGE
INTRAMUSCULAR | Status: DC | PRN
  Administered 2021-09-10: 50 mg via INTRAVENOUS

## 2021-09-10 MED ORDER — ACETAMINOPHEN 160 MG/5ML PO SOLN: 325.0000 mg | ORAL | Status: DC | PRN

## 2021-09-10 MED ORDER — FENTANYL CITRATE (PF) 100 MCG/2ML IJ SOLN
INTRAMUSCULAR | Status: DC | PRN
  Administered 2021-09-10 (×4): 50 ug via INTRAVENOUS

## 2021-09-10 MED ORDER — HYDROMORPHONE HCL 1 MG/ML IJ SOLN
0.5000 mg | Freq: Once | INTRAMUSCULAR | Status: AC
  Administered 2021-09-10: 0.5 mg via INTRAVENOUS
  Filled 2021-09-10: qty 1

## 2021-09-10 MED ORDER — MIDAZOLAM HCL 5 MG/5ML IJ SOLN
INTRAMUSCULAR | Status: DC | PRN
  Administered 2021-09-10: 2 mg via INTRAVENOUS

## 2021-09-10 MED ORDER — PROPOFOL 10 MG/ML IV BOLUS
INTRAVENOUS | Status: AC
  Filled 2021-09-10: qty 20

## 2021-09-10 MED ORDER — SODIUM CHLORIDE 0.9 % IV BOLUS
1000.0000 mL | Freq: Once | INTRAVENOUS | Status: AC
  Administered 2021-09-10: 1000 mL via INTRAVENOUS

## 2021-09-10 MED ORDER — OXYCODONE HCL 5 MG PO TABS: 5.0000 mg | ORAL_TABLET | Freq: Four times a day (QID) | ORAL | 0 refills | Status: AC | PRN

## 2021-09-10 MED ORDER — LACTATED RINGERS IV SOLN: INTRAVENOUS | Status: DC

## 2021-09-10 MED ORDER — ROCURONIUM BROMIDE 10 MG/ML (PF) SYRINGE
PREFILLED_SYRINGE | INTRAVENOUS | Status: DC | PRN
  Administered 2021-09-10: 60 mg via INTRAVENOUS

## 2021-09-10 MED ORDER — FENTANYL CITRATE PF 50 MCG/ML IJ SOSY
PREFILLED_SYRINGE | INTRAMUSCULAR | Status: AC
  Filled 2021-09-10: qty 3

## 2021-09-10 MED ORDER — ONDANSETRON HCL 4 MG/2ML IJ SOLN
INTRAMUSCULAR | Status: AC
  Filled 2021-09-10: qty 2

## 2021-09-10 MED ORDER — AMISULPRIDE (ANTIEMETIC) 5 MG/2ML IV SOLN: 10.0000 mg | Freq: Once | INTRAVENOUS | Status: DC | PRN

## 2021-09-10 MED ORDER — OXYCODONE HCL 5 MG PO TABS: 5.0000 mg | ORAL_TABLET | Freq: Once | ORAL | Status: AC | PRN

## 2021-09-10 MED ORDER — PROPOFOL 10 MG/ML IV BOLUS
INTRAVENOUS | Status: DC | PRN
  Administered 2021-09-10: 150 mg via INTRAVENOUS

## 2021-09-10 MED ORDER — OXYCODONE HCL 5 MG/5ML PO SOLN: 5.0000 mg | Freq: Once | ORAL | Status: AC | PRN

## 2021-09-10 MED ORDER — DEXAMETHASONE SODIUM PHOSPHATE 10 MG/ML IJ SOLN
INTRAMUSCULAR | Status: AC
  Filled 2021-09-10: qty 1

## 2021-09-10 MED ORDER — DEXAMETHASONE SODIUM PHOSPHATE 4 MG/ML IJ SOLN
INTRAMUSCULAR | Status: DC | PRN
  Administered 2021-09-10: 10 mg via INTRAVENOUS

## 2021-09-10 MED ORDER — ROCURONIUM BROMIDE 10 MG/ML (PF) SYRINGE
PREFILLED_SYRINGE | INTRAVENOUS | Status: AC
  Filled 2021-09-10: qty 10

## 2021-09-10 MED ORDER — 0.9 % SODIUM CHLORIDE (POUR BTL) OPTIME
TOPICAL | Status: DC | PRN
  Administered 2021-09-10: 1000 mL

## 2021-09-10 MED ORDER — CEFAZOLIN SODIUM-DEXTROSE 2-4 GM/100ML-% IV SOLN
2.0000 g | Freq: Once | INTRAVENOUS | Status: DC
  Filled 2021-09-10: qty 100

## 2021-09-10 MED ORDER — MIDAZOLAM HCL 2 MG/2ML IJ SOLN
INTRAMUSCULAR | Status: AC
  Filled 2021-09-10: qty 2

## 2021-09-10 MED ORDER — OXYCODONE HCL 5 MG PO TABS: 5.0000 mg | ORAL_TABLET | Freq: Four times a day (QID) | ORAL | 0 refills | Status: DC | PRN

## 2021-09-10 MED ORDER — MORPHINE SULFATE (PF) 4 MG/ML IV SOLN
4.0000 mg | Freq: Once | INTRAVENOUS | Status: AC
  Administered 2021-09-10: 4 mg via INTRAVENOUS
  Filled 2021-09-10: qty 1

## 2021-09-10 MED ORDER — ACETAMINOPHEN 325 MG PO TABS: 325.0000 mg | ORAL_TABLET | ORAL | Status: DC | PRN

## 2021-09-10 MED ORDER — LACTATED RINGERS IR SOLN
Status: DC | PRN
  Administered 2021-09-10: 1000 mL

## 2021-09-10 MED ORDER — PROMETHAZINE HCL 25 MG/ML IJ SOLN: 6.2500 mg | INTRAMUSCULAR | Status: DC | PRN

## 2021-09-10 MED ORDER — SUGAMMADEX SODIUM 200 MG/2ML IV SOLN
INTRAVENOUS | Status: DC | PRN
  Administered 2021-09-10: 200 mg via INTRAVENOUS

## 2021-09-10 MED ORDER — LIDOCAINE HCL (PF) 2 % IJ SOLN
INTRAMUSCULAR | Status: AC
  Filled 2021-09-10: qty 5

## 2021-09-10 MED ORDER — BUPIVACAINE HCL (PF) 0.5 % IJ SOLN
INTRAMUSCULAR | Status: DC | PRN
  Administered 2021-09-10: 20 mL

## 2021-09-10 MED ORDER — ACETAMINOPHEN 10 MG/ML IV SOLN: 1000.0000 mg | Freq: Once | INTRAVENOUS | Status: DC | PRN

## 2021-09-10 MED ORDER — FENTANYL CITRATE PF 50 MCG/ML IJ SOSY
25.0000 ug | PREFILLED_SYRINGE | INTRAMUSCULAR | Status: DC | PRN
  Administered 2021-09-10 (×3): 50 ug via INTRAVENOUS

## 2021-09-10 MED ORDER — OXYCODONE HCL 5 MG PO TABS
ORAL_TABLET | ORAL | Status: AC
  Administered 2021-09-10: 5 mg via ORAL
  Filled 2021-09-10: qty 1

## 2021-09-10 MED ORDER — ONDANSETRON HCL 4 MG/2ML IJ SOLN
INTRAMUSCULAR | Status: DC | PRN
  Administered 2021-09-10: 4 mg via INTRAVENOUS

## 2021-09-10 MED ORDER — BUPIVACAINE HCL (PF) 0.5 % IJ SOLN
INTRAMUSCULAR | Status: AC
  Filled 2021-09-10: qty 30

## 2021-09-10 SURGICAL SUPPLY — 35 items
ADH SKN CLS APL DERMABOND .7 (GAUZE/BANDAGES/DRESSINGS) ×1
APL PRP STRL LF DISP 70% ISPRP (MISCELLANEOUS) ×1
APPLIER CLIP 5 13 M/L LIGAMAX5 (MISCELLANEOUS) ×2
APR CLP MED LRG 5 ANG JAW (MISCELLANEOUS) ×1
BAG COUNTER SPONGE SURGICOUNT (BAG) IMPLANT
BAG SPEC RTRVL LRG 6X4 10 (ENDOMECHANICALS) ×1
BAG SPNG CNTER NS LX DISP (BAG)
CABLE HIGH FREQUENCY MONO STRZ (ELECTRODE) ×2 IMPLANT
CHLORAPREP W/TINT 26 (MISCELLANEOUS) ×2 IMPLANT
CLIP APPLIE 5 13 M/L LIGAMAX5 (MISCELLANEOUS) ×1 IMPLANT
COVER MAYO STAND STRL (DRAPES) IMPLANT
DECANTER SPIKE VIAL GLASS SM (MISCELLANEOUS) ×2 IMPLANT
DERMABOND ADVANCED (GAUZE/BANDAGES/DRESSINGS) ×1
DERMABOND ADVANCED .7 DNX12 (GAUZE/BANDAGES/DRESSINGS) ×1 IMPLANT
DRAPE C-ARM 42X120 X-RAY (DRAPES) IMPLANT
ELECT REM PT RETURN 15FT ADLT (MISCELLANEOUS) ×2 IMPLANT
GLOVE SURG ENC MOIS LTX SZ7.5 (GLOVE) ×2 IMPLANT
GOWN STRL REUS W/TWL XL LVL3 (GOWN DISPOSABLE) ×4 IMPLANT
HEMOSTAT SURGICEL 4X8 (HEMOSTASIS) IMPLANT
IRRIG SUCT STRYKERFLOW 2 WTIP (MISCELLANEOUS) ×2
IRRIGATION SUCT STRKRFLW 2 WTP (MISCELLANEOUS) ×1 IMPLANT
KIT BASIN OR (CUSTOM PROCEDURE TRAY) ×2 IMPLANT
KIT TURNOVER KIT A (KITS) IMPLANT
PENCIL SMOKE EVACUATOR (MISCELLANEOUS) IMPLANT
POUCH SPECIMEN RETRIEVAL 10MM (ENDOMECHANICALS) ×2 IMPLANT
SCISSORS LAP 5X35 DISP (ENDOMECHANICALS) ×2 IMPLANT
SET CHOLANGIOGRAPH MIX (MISCELLANEOUS) IMPLANT
SET TUBE SMOKE EVAC HIGH FLOW (TUBING) ×2 IMPLANT
SLEEVE XCEL OPT CAN 5 100 (ENDOMECHANICALS) ×4 IMPLANT
SUT MNCRL AB 4-0 PS2 18 (SUTURE) ×2 IMPLANT
TOWEL OR 17X26 10 PK STRL BLUE (TOWEL DISPOSABLE) ×2 IMPLANT
TOWEL OR NON WOVEN STRL DISP B (DISPOSABLE) ×2 IMPLANT
TRAY LAPAROSCOPIC (CUSTOM PROCEDURE TRAY) ×2 IMPLANT
TROCAR BLADELESS OPT 5 100 (ENDOMECHANICALS) ×2 IMPLANT
TROCAR XCEL BLUNT TIP 100MML (ENDOMECHANICALS) ×2 IMPLANT

## 2021-09-10 NOTE — ED Notes (Signed)
Patient continues to complain of abdominal pain. Husband ar bedside.

## 2021-09-10 NOTE — ED Provider Notes (Signed)
[facility]-EMERGENCY DEPT Provider Note   CSN: [id number] Arrival date & time: 09/09/21  1842     History Chief Complaint  Patient presents with   Abdominal Pain    Brandi Crawford is a 55 y.o. female presents to the ED with c/o RUQ abd pain worse after eating. Pt reports the pain began around 11am.  Reports nothing to eat since that time.  6 episodes of nonbloody but somewhat bilious emesis. Pt reports trying Bentyl which she was previously prescribed without improvement. No alleviating factors. Pt reports the pain today was more intense than previously.   Pt reports she has had similar pain intermittently for the last 3 years. Pt reports she has known gallstones (1 stone).  She has never seen a Careers adviser. She has been following a low fat, low grease and low spice diet. This has helped until approx 2 mos ago.     The history is provided by the patient, the spouse and medical records. The history is limited by a language barrier. No language interpreter was used.      No past medical history on file.  There are no problems to display for this patient.   Past Surgical History:  Procedure Laterality Date   CARPAL TUNNEL RELEASE Left      OB History     Gravida      Para      Term      Preterm      AB      Living  2      SAB      IAB      Ectopic      Multiple      Live Births              No family history on file.  Social History   Tobacco Use   Smoking status: Never  Substance Use Topics   Alcohol use: Not Currently   Drug use: Not Currently    Home Medications Prior to Admission medications   Medication Sig Start Date End Date Taking? Authorizing Provider  dicyclomine (BENTYL) 20 MG tablet Take 1 tablet (20 mg total) by mouth 2 (two) times daily for 14 days. 10/05/18 10/19/18  Claude Manges, PA-C  Multiple Vitamin (MULTIVITAMIN WITH MINERALS) TABS tablet Take 1 tablet by mouth daily.    [provider]     Allergies    Contrast media [iodinated diagnostic agents]  Review of Systems   Review of Systems  Constitutional:  Negative for appetite change, diaphoresis, fatigue, fever and unexpected weight change.  HENT:  Negative for mouth sores.   Eyes:  Negative for visual disturbance.  Respiratory:  Negative for cough, chest tightness, shortness of breath and wheezing.   Cardiovascular:  Negative for chest pain.  Gastrointestinal:  Positive for abdominal pain, nausea and vomiting. Negative for constipation and diarrhea.  Endocrine: Negative for polydipsia, polyphagia and polyuria.  Genitourinary:  Negative for dysuria, frequency, hematuria and urgency.  Musculoskeletal:  Negative for back pain and neck stiffness.  Skin:  Negative for rash.  Allergic/Immunologic: Negative for immunocompromised state.  Neurological:  Negative for syncope, light-headedness and headaches.  Hematological:  Does not bruise/bleed easily.  Psychiatric/Behavioral:  Negative for sleep disturbance. The patient is not nervous/anxious.    Physical Exam Updated Vital Signs BP 131/69 (BP Location: Right Arm)   Pulse (!) 54   Temp 98.8 F (37.1 C) (Oral)   Resp 18   Ht 5\' 3"  (1.6 m)  Wt 85.3 kg   LMP 10/05/2017   SpO2 99%   BMI 33.30 kg/m   Physical Exam Vitals and nursing note reviewed.  Constitutional:      General: She is not in acute distress.    Appearance: She is well-developed. She is not diaphoretic.     Comments: Awake, alert, nontoxic appearance Uncomfortable appearing  HENT:     Head: Normocephalic and atraumatic.     Mouth/Throat:     Pharynx: No oropharyngeal exudate.  Eyes:     General: No scleral icterus.    Conjunctiva/sclera: Conjunctivae normal.  Cardiovascular:     Rate and Rhythm: Normal rate and regular rhythm.  Pulmonary:     Effort: Pulmonary effort is normal. No respiratory distress.     Breath sounds: Normal breath sounds. No wheezing.  Abdominal:     General: Bowel  sounds are normal. There is no distension.     Palpations: Abdomen is soft. There is no mass.     Tenderness: There is abdominal tenderness in the right upper quadrant. There is no guarding or rebound.  Musculoskeletal:        General: Normal range of motion.     Cervical back: Normal range of motion and neck supple.  Skin:    General: Skin is warm and dry.  Neurological:     Mental Status: She is alert.     Comments: Speech is clear and goal oriented Moves extremities without ataxia  Psychiatric:        Mood and Affect: Mood normal.    ED Results / Procedures / Treatments   Labs (all labs ordered are listed, but only abnormal results are displayed) Labs Reviewed  CBC WITH DIFFERENTIAL/PLATELET - Abnormal; Notable for the following components:      Result Value   WBC 12.7 (*)    Neutro Abs 10.6 (*)    All other components within normal limits  COMPREHENSIVE METABOLIC PANEL - Abnormal; Notable for the following components:   Glucose, Bld 112 (*)    All other components within normal limits  URINALYSIS, ROUTINE W REFLEX MICROSCOPIC - Abnormal; Notable for the following components:   Ketones, ur 80 (*)    All other components within normal limits  RESP PANEL BY RT-PCR (FLU A&B, COVID) ARPGX2  LIPASE, BLOOD  I-STAT BETA HCG BLOOD, ED ([facility], [facility], [facility] ONLY)    EKG None  Radiology US Abdomen Limited  Result Date: 09/09/2021 CLINICAL DATA:  RUQ pain/ cholelithiasis hx EXAM: ULTRASOUND ABDOMEN LIMITED RIGHT UPPER QUADRANT COMPARISON:  Sound abdomen 10/05/2018 FINDINGS: Gallbladder: 3.3 cm calcified gallstone within the gallbladder lumen. Gallbladder wall findings suggestive adenomyomatosis. Otherwise no gallbladder wall thickening or pericholecystic fluid visualized. No sonographic Murphy sign noted by sonographer. Common bile duct: Diameter: 6 mm Liver: No focal lesion identified. Within normal limits in parenchymal echogenicity. Portal vein is patent on color Doppler imaging with  normal direction of blood flow towards the liver. Other: None. IMPRESSION: Cholelithiasis and adenomyomatosis. No findings of acute cholecystitis. Electronically Signed   By: Iven Finn M.D.   On: 09/09/2021 20:20    Procedures Procedures   Medications Ordered in ED Medications  morphine 4 MG/ML injection 4 mg (4 mg Intramuscular Given 09/09/21 2311)  ondansetron (ZOFRAN-ODT) disintegrating tablet 8 mg (8 mg Oral Given 09/09/21 2311)  sodium chloride 0.9 % bolus 1,000 mL (0 mLs Intravenous Stopped 09/10/21 0409)  morphine 4 MG/ML injection 4 mg (4 mg Intravenous Given 09/10/21 0324)  HYDROmorphone (DILAUDID) injection 0.5 mg (  0.5 mg Intravenous Given 09/10/21 0409)    ED Course  I have reviewed the triage vital signs and the nursing notes.  Pertinent labs & imaging results that were available during my care of the patient were reviewed by me and considered in my medical decision making (see chart for details).  Clinical Course as of 09/10/21 0536  Wed Sep 10, 2021  0210 Ketones, ur(!): 22 Dehydrated - will give fluids [HM]  0210 AST: 21 AST/ALT are WNL [HM]  0210 Lipase: 24 WNL [HM]  0210 US Abdomen Limited No evidence of acute cholecystitis  [HM]    Clinical Course User Index [HM] Jaeanna Mccomber, Gwenlyn Perking   MDM Rules/Calculators/A&P                           Patient presents with right upper quadrant abdominal pain.  Initial concern for cholecystitis.  Ultrasound shows cholelithiasis without evidence of cholecystitis.  AST, ALT and lipase are all within normal limits.  Patient reports intermittent issues over the last several months worsening tonight.  Patient reports bilious emesis.  Will give pain control, fluids and reassess.  5:25 AM Patient continues to have severe pain.  She has been given Zofran, morphine x2 and dilaudid without significant relief.  She remains n.p.o. as she is not tolerating fluids.  Dehydration noted.  Given severity and persistence of pain will  discuss with general surgery.  The patient was discussed with and evaluated by Dr. Christy Gentles who agrees with the treatment plan.  5:35 AM Discussed with Dr. Windle Guard who will have the surgical team assess this morning.  BP 124/63   Pulse 65   Temp 98.8 F (37.1 C) (Oral)   Resp 17   Ht 5\' 3"  (1.6 m)   Wt 85.3 kg   LMP 10/05/2017   SpO2 94%   BMI 33.30 kg/m    Final Clinical Impression(s) / ED Diagnoses Final diagnoses:  Calculus of gallbladder without cholecystitis without obstruction  Intractable abdominal pain  Bilious vomiting with nausea    Rx / DC Orders ED Discharge Orders     None        Agapito Games 09/10/21 0536    Ripley Fraise, MD 09/10/21 [id number]

## 2021-09-10 NOTE — Transfer of Care (Signed)
Immediate Anesthesia Transfer of Care Note  Patient: [name]  Procedure(s) Performed: LAPAROSCOPIC CHOLECYSTECTOMY (Abdomen)  Patient Location: PACU  Anesthesia Type:General  Level of Consciousness: awake, alert , oriented and patient cooperative  Airway & Oxygen Therapy: Patient Spontanous Breathing and Patient connected to face mask  Post-op Assessment: Report given to RN and Post -op Vital signs reviewed and stable  Post vital signs: Reviewed and stable  Last Vitals:  Vitals Value Taken Time  BP 123/78 09/10/21 1209  Temp 36.7 C 09/10/21 1209  Pulse 58 09/10/21 1214  Resp 12 09/10/21 1209  SpO2 100 % 09/10/21 1214  Vitals shown include unvalidated device data.  Last Pain:  Vitals:   09/10/21 0717  TempSrc:   PainSc: 6       Patients Stated Pain Goal: 5 (09/10/21 0717)  Complications: No notable events documented.

## 2021-09-10 NOTE — ED Notes (Signed)
Patient last had something to eat at 11am on 09/09/2021,and something to drink at 2pm on 09/10/21

## 2021-09-10 NOTE — Discharge Instructions (Signed)
[facility] LAPAROSCOPIC SURGERY: POST OP INSTRUCTIONS Always review your discharge instruction sheet given to you by the facility where your surgery was performed. IF YOU HAVE DISABILITY OR FAMILY LEAVE FORMS, YOU MUST BRING THEM TO THE OFFICE FOR PROCESSING.   DO NOT GIVE THEM TO YOUR DOCTOR.  PAIN CONTROL  First take acetaminophen (Tylenol) AND/or ibuprofen (Advil) to control your pain after surgery.  Follow directions on package.  Taking acetaminophen (Tylenol) and/or ibuprofen (Advil) regularly after surgery will help to control your pain and lower the amount of prescription pain medication you may need.  You should not take more than 3,000 mg (3 grams) of acetaminophen (Tylenol) in 24 hours.  You should not take ibuprofen (Advil), aleve, motrin, naprosyn or other NSAIDS if you have a history of stomach ulcers or chronic kidney disease.  A prescription for pain medication may be given to you upon discharge.  Take your pain medication as prescribed, if you still have uncontrolled pain after taking acetaminophen (Tylenol) or ibuprofen (Advil). Use ice packs to help control pain. If you need a refill on your pain medication, please contact your pharmacy.  They will contact our office to request authorization. Prescriptions will not be filled after 5pm or on week-ends.  HOME MEDICATIONS Take your usually prescribed medications unless otherwise directed.  DIET You should follow a light diet the first few days after arrival home.  Be sure to include lots of fluids daily. Avoid fatty, fried foods.   CONSTIPATION It is common to experience some constipation after surgery and if you are taking pain medication.  Increasing fluid intake and taking a stool softener (such as Colace) will usually help or prevent this problem from occurring.  A mild laxative (Milk of Magnesia or Miralax) should be taken according to package instructions if there are no bowel movements after 48  hours.  WOUND/INCISION CARE Most patients will experience some swelling and bruising in the area of the incisions.  Ice packs will help.  Swelling and bruising can take several days to resolve.  Unless discharge instructions indicate otherwise, follow guidelines below  STERI-STRIPS - you may remove your outer bandages 48 hours after surgery, and you may shower at that time.  You have steri-strips (small skin tapes) in place directly over the incision.  These strips should be left on the skin for 7-10 days.   DERMABOND/SKIN GLUE - you may shower in 24 hours.  The glue will flake off over the next 2-3 weeks. Any sutures or staples will be removed at the office during your follow-up visit.  ACTIVITIES You may resume regular (light) daily activities beginning the next day--such as daily self-care, walking, climbing stairs--gradually increasing activities as tolerated.  You may have sexual intercourse when it is comfortable.  Refrain from any heavy lifting or straining until approved by your doctor. You may drive when you are no longer taking prescription pain medication, you can comfortably wear a seatbelt, and you can safely maneuver your car and apply brakes.  FOLLOW-UP You should see your doctor in the office for a follow-up appointment approximately 2-3 weeks after your surgery.  You should have been given your post-op/follow-up appointment when your surgery was scheduled.  If you did not receive a post-op/follow-up appointment, make sure that you call for this appointment within a day or two after you arrive home to insure a convenient appointment time.   WHEN TO CALL YOUR DOCTOR: Fever over 101.0 Inability to urinate Continued bleeding from incision.   Increased pain, redness, or drainage from the incision. Increasing abdominal pain  The clinic staff is available to answer your questions during regular business hours.  Please don't hesitate to call and ask to speak to one of the nurses for  clinical concerns.  If you have a medical emergency, go to the nearest emergency room or call 911.  A surgeon from [facility] is always on call at the hospital. [address], [address], [place], [place]  [address] ? [address] [address], [place], [place]   [address] [phone] ? [phone] ? FAX [phone] Web site: [email or web address]  _________________________________________________  CIRUGIA LAPAROSCOPICA: INSTRUCCIONES DE POST OPERATORIO.  Revise siempre los documentos que le entreguen en el lugar donde se ha hecho la cirugia.  SI USTED NECESITA DOCUMENTOS DE INCAPACIDAD (DISABLE) O DE PERMISO FAMILAR (FAMILY LEAVE) NECESITA TRAERLOS A LA OFICINA PARA QUE SEAN PROCESADOS. NO  SE LOS DE A SU DOCTOR. A su alta del hospital se le dara una receta para controlar el dolor. Tomela como ha sido recetada, si la necesita. Si no la necesita puede tomar, Acetaminofen (Tylenol) o Ibuprofen (Advil) para aliviar dolor moderado. Continue tomando el resto de sus medicinas. Si necesita rellenar la receta, llame a la farmacia. ellos contactan a nuestra oficina pidiendo autorizacion. Este tipo de receta no pueden ser rellenadas despues de las  5pm o durante los fines de semana. Con relacion a la dieta: debe ser ligera los primeros dias despues que llege a la casa. Ejemplo: sopas y galleticas. Tome bastante liquido esos dias. La mayoria de los pacientes padecen de inflamacion y cambio de coloracion de la piel alrededor de las incisiones. esto toma dias en resolver.  pnerse una bolsa de hielo en el area affectada ayuda..  Es comun tambien tener un poco de estrenimiento si esta tomado medicinas para el dolor. incremente la cantidad de liquidos a tomar y puede tomar (Colace) esto previene el problema. Si ya tiene estrenimiento, es decir no ha defecado en 48 horas, puede tomar un laxativo (Milk of Magnesia or Miralax) uselo como el paquete le explica.  A menos que se le diga algo  diferente. Remueva el bendaje a las 24-48 horas despues dela cirugia. y puede banarse en la ducha sin ningun problema. usted puede tener steri-strips (pequenas curitas transparentes en la piel puesta encima de la incision)  Estas banditas strips should be left on the skin for 7-10 days.   Si su cirujano puso pegamento encima de la incision usted puede banarse bajo la ducha en 24 horas. Este pegamento empezara a caerse en las proximas 2-3 semanas. Si le pusieron suturas o presillas (grapos) estos seran quitados en su proxima cita en la oficina. . ACTIVIDADES:  Puede hacer actividad ligera.  Como caminar , subir escaleras y poco a poco irlas incrementando tanto como las tolere. Puede tener relaciones sexuales cuando sea comfortable. No carge objetos pesados o haga esfuerzos que no sean aprovados por su doctor. Puede manejar en cuanto no esta tomando medicamentos fuertes (narcoticos) para el dolor, pueda abrochar confortablemente el cinturon de seguridad, y pueda maniobrar y usar los pedales de su vehiculo con seguridad. PUEDE REGRESAR A TRABAJAR  Debe ver a su doctor para una cita de seguimiento en 2-3 semanas despues de la cirugia.  OTRAS ISNSTRUCCIONES:___________________________________________________________________________________ CUANDO LLAMAR A SU MEDICO: FIEBRE mayor de  101.0 No produccion de orina. Sangramiento continue de la herida Incremento de dolor, enrojecimientio o drenaje de la herida (incision) Incremento de dolor abdominal.  The clinic   staff is available to answer your questions during regular business hours.  Please don't hesitate to call and ask to speak to one of the nurses for clinical concerns.  If you have a medical emergency, go to the nearest emergency room or call 911.  A surgeon from [facility] is always on call at the hospital. [address], [address], [place], [place]  [address] ? [address] [address], [place], [place]   [address] [phone] ? [phone] ?  FAX [phone] Web site: [email or web address]  

## 2021-09-10 NOTE — Anesthesia Preprocedure Evaluation (Signed)
Anesthesia Evaluation  Patient identified by MRN, date of birth, ID band Patient awake    Reviewed: Allergy & Precautions, NPO status , Patient's Chart, lab work & pertinent test results  Airway Mallampati: III  TM Distance: >3 FB Neck ROM: Full    Dental  (+) Teeth Intact, Dental Advisory Given   Pulmonary neg pulmonary ROS,    breath sounds clear to auscultation       Cardiovascular negative cardio ROS   Rhythm:Regular Rate:Normal     Neuro/Psych negative neurological ROS  negative psych ROS   GI/Hepatic negative GI ROS, Neg liver ROS,   Endo/Other  negative endocrine ROS  Renal/GU negative Renal ROS     Musculoskeletal   Abdominal (+) - obese,   Peds  Hematology negative hematology ROS (+)   Anesthesia Other Findings   Reproductive/Obstetrics                             Anesthesia Physical Anesthesia Plan  ASA: 1  Anesthesia Plan: General   Post-op Pain Management:    Induction: Intravenous  PONV Risk Score and Plan: 4 or greater and Ondansetron, Dexamethasone, Midazolam and Scopolamine patch - Pre-op  Airway Management Planned: Oral ETT  Additional Equipment: None  Intra-op Plan:   Post-operative Plan: Extubation in OR  Informed Consent: I have reviewed the patients History and Physical, chart, labs and discussed the procedure including the risks, benefits and alternatives for the proposed anesthesia with the patient or authorized representative who has indicated his/her understanding and acceptance.     Dental advisory given and Interpreter used for interveiw  Plan Discussed with: CRNA  Anesthesia Plan Comments:         Anesthesia Quick Evaluation

## 2021-09-10 NOTE — Telephone Encounter (Signed)
[facility]:  Fax: [phone]  Follow up apt with PA Puja from location due to having surgery in hospital. Need a referral to be able to schedule appointment.

## 2021-09-10 NOTE — H&P (Signed)
CC: Right upper quadrant abdominal pain   HPI: Brandi Crawford is an 55 y.o. female who is here for evaluation of right upper quadrant abdominal pain and gallstones.  She presents with sudden onset of right upper quadrant abdominal pain after eating.  She had multiple episodes of emesis.  She had nausea as well.  She presented to the urgency department.  She had an ultrasound showing cholelithiasis.  She did have an elevated white blood count but normal liver function test.  She is still having discomfort.  She denies jaundice.  She is otherwise healthy and without complaints.  Bowel movements are normal.  She describes her pain as sharp and moderate to severe in the right upper quadrant.  It does not refer anywhere else.  No past medical history on file.  Past Surgical History:  Procedure Laterality Date   CARPAL TUNNEL RELEASE Left     No family history on file.  Social:  reports that she has never smoked. She does not have any smokeless tobacco history on file. She reports that she does not currently use alcohol. She reports that she does not currently use drugs.  Allergies:  Allergies  Allergen Reactions   Contrast Media [Iodinated Diagnostic Agents] Itching    Medications: I have reviewed the patient's current medications.  Results for orders placed or performed during the hospital encounter of 09/09/21 (from the past 48 hour(s))  Urinalysis, Routine w reflex microscopic Urine, Clean Catch     Status: Abnormal   Collection Time: 09/09/21  7:34 PM  Result Value Ref Range   Color, Urine YELLOW YELLOW   APPearance CLEAR CLEAR   Specific Gravity, Urine 1.018 1.005 - 1.030   pH 7.0 5.0 - 8.0   Glucose, UA NEGATIVE NEGATIVE mg/dL   Hgb urine dipstick NEGATIVE NEGATIVE   Bilirubin Urine NEGATIVE NEGATIVE   Ketones, ur 80 (A) NEGATIVE mg/dL   Protein, ur NEGATIVE NEGATIVE mg/dL   Nitrite NEGATIVE NEGATIVE   Leukocytes,Ua NEGATIVE NEGATIVE    Comment: Performed at [facility] [address], [place], [place] [address]  CBC with Differential     Status: Abnormal   Collection Time: 09/09/21  7:55 PM  Result Value Ref Range   WBC 12.7 (H) 4.0 - 10.5 K/uL   RBC 4.31 3.87 - 5.11 MIL/uL   Hemoglobin 12.7 12.0 - 15.0 g/dL   HCT 38.3 36.0 - 46.0 %   MCV 88.9 80.0 - 100.0 fL   MCH 29.5 26.0 - 34.0 pg   MCHC 33.2 30.0 - 36.0 g/dL   RDW 12.6 11.5 - 15.5 %   Platelets 343 150 - 400 K/uL   nRBC 0.0 0.0 - 0.2 %   Neutrophils Relative % 83 %   Neutro Abs 10.6 (H) 1.7 - 7.7 K/uL   Lymphocytes Relative 12 %   Lymphs Abs 1.5 0.7 - 4.0 K/uL   Monocytes Relative 4 %   Monocytes Absolute 0.5 0.1 - 1.0 K/uL   Eosinophils Relative 0 %   Eosinophils Absolute 0.0 0.0 - 0.5 K/uL   Basophils Relative 0 %   Basophils Absolute 0.0 0.0 - 0.1 K/uL   Immature Granulocytes 1 %   Abs Immature Granulocytes 0.06 0.00 - 0.07 K/uL    Comment: Performed at [facility], [place] [address] [place], [place] [address]  Comprehensive metabolic panel     Status: Abnormal   Collection Time: 09/09/21  7:55 PM  Result Value Ref Range  Sodium 137 135 - 145 mmol/L   Potassium 4.0 3.5 - 5.1 mmol/L   Chloride 104 98 - 111 mmol/L   CO2 26 22 - 32 mmol/L   Glucose, Bld 112 (H) 70 - 99 mg/dL    Comment: Glucose reference range applies only to samples taken after fasting for at least 8 hours.   BUN 7 6 - 20 mg/dL   Creatinine, Ser 4.25 0.44 - 1.00 mg/dL   Calcium 9.4 8.9 - 95.6 mg/dL   Total Protein 7.7 6.5 - 8.1 g/dL   Albumin 4.2 3.5 - 5.0 g/dL   AST 21 15 - 41 U/L   ALT 16 0 - 44 U/L   Alkaline Phosphatase 86 38 - 126 U/L   Total Bilirubin 0.4 0.3 - 1.2 mg/dL   GFR, Estimated >38 >75 mL/min    Comment: (NOTE) Calculated using the CKD-EPI Creatinine Equation (2021)    Anion gap 7 5 - 15    Comment: Performed at [facility], [address] [place], [place] [address]  Lipase, blood     Status: None   Collection Time: 09/09/21   7:55 PM  Result Value Ref Range   Lipase 24 11 - 51 U/L    Comment: Performed at [facility], [address] [place], [place] [address]  I-Stat Beta hCG blood, ED ([facility], [facility], [facility] only)     Status: None   Collection Time: 09/09/21  8:00 PM  Result Value Ref Range   I-stat hCG, quantitative <5.0 <5 mIU/mL   Comment 3            Comment:   GEST. AGE      CONC.  (mIU/mL)   <=1 WEEK        5 - 50     2 WEEKS       50 - 500     3 WEEKS       100 - 10,000     4 WEEKS     1,000 - 30,000        FEMALE AND NON-PREGNANT FEMALE:     LESS THAN 5 mIU/mL   Resp Panel by RT-PCR (Flu A&B, Covid) Nasopharyngeal Swab     Status: None   Collection Time: 09/10/21  5:34 AM   Specimen: Nasopharyngeal Swab; Nasopharyngeal(NP) swabs in vial transport medium  Result Value Ref Range   SARS Coronavirus 2 by RT PCR NEGATIVE NEGATIVE    Comment: (NOTE) SARS-CoV-2 target nucleic acids are NOT DETECTED.  The SARS-CoV-2 RNA is generally detectable in upper respiratory specimens during the acute phase of infection. The lowest concentration of SARS-CoV-2 viral copies this assay can detect is 138 copies/mL. A negative result does not preclude SARS-Cov-2 infection and should not be used as the sole basis for treatment or other patient management decisions. A negative result may occur with  improper specimen collection/handling, submission of specimen other than nasopharyngeal swab, presence of viral mutation(s) within the areas targeted by this assay, and inadequate number of viral copies(<138 copies/mL). A negative result must be combined with clinical observations, patient history, and epidemiological information. The expected result is Negative.  Fact Sheet for Patients:  BloggerCourse.com  Fact Sheet for Healthcare Providers:  SeriousBroker.it  This test is no t yet approved or cleared by the [place] FDA and  has been authorized for  detection and/or diagnosis of SARS-CoV-2 by FDA under an Emergency Use Authorization (EUA). This EUA will remain  in effect (meaning this  test can be used) for the duration of the COVID-19 declaration under Section 564(b)(1) of the Act, 21 U.S.C.section 360bbb-3(b)(1), unless the authorization is terminated  or revoked sooner.       Influenza A by PCR NEGATIVE NEGATIVE   Influenza B by PCR NEGATIVE NEGATIVE    Comment: (NOTE) The Xpert Xpress SARS-CoV-2/FLU/RSV plus assay is intended as an aid in the diagnosis of influenza from Nasopharyngeal swab specimens and should not be used as a sole basis for treatment. Nasal washings and aspirates are unacceptable for Xpert Xpress SARS-CoV-2/FLU/RSV testing.  Fact Sheet for Patients: EntrepreneurPulse.com.au  Fact Sheet for Healthcare Providers: IncredibleEmployment.be  This test is not yet approved or cleared by the [place] FDA and has been authorized for detection and/or diagnosis of SARS-CoV-2 by FDA under an Emergency Use Authorization (EUA). This EUA will remain in effect (meaning this test can be used) for the duration of the COVID-19 declaration under Section 564(b)(1) of the Act, 21 U.S.C. section 360bbb-3(b)(1), unless the authorization is terminated or revoked.  Performed at [facility], [place] [address] [place], [place] [address]     US Abdomen Limited  Result Date: 09/09/2021 CLINICAL DATA:  RUQ pain/ cholelithiasis hx EXAM: ULTRASOUND ABDOMEN LIMITED RIGHT UPPER QUADRANT COMPARISON:  Sound abdomen 10/05/2018 FINDINGS: Gallbladder: 3.3 cm calcified gallstone within the gallbladder lumen. Gallbladder wall findings suggestive adenomyomatosis. Otherwise no gallbladder wall thickening or pericholecystic fluid visualized. No sonographic Murphy sign noted by sonographer. Common bile duct: Diameter: 6 mm Liver: No focal lesion identified. Within normal limits in  parenchymal echogenicity. Portal vein is patent on color Doppler imaging with normal direction of blood flow towards the liver. Other: None. IMPRESSION: Cholelithiasis and adenomyomatosis. No findings of acute cholecystitis. Electronically Signed   By: Iven Finn M.D.   On: 09/09/2021 20:20    ROS - all of the below systems have been reviewed with the patient and positives are indicated with bold text General: chills, fever or night sweats Eyes: blurry vision or double vision ENT: epistaxis or sore throat Allergy/Immunology: itchy/watery eyes or nasal congestion Hematologic/Lymphatic: bleeding problems, blood clots or swollen lymph nodes Endocrine: temperature intolerance or unexpected weight changes Breast: new or changing breast lumps or nipple discharge Resp: cough, shortness of breath, or wheezing CV: chest pain or dyspnea on exertion GI: as per HPI GU: dysuria, trouble voiding, or hematuria MSK: joint pain or joint stiffness Neuro: TIA or stroke symptoms Derm: pruritus and skin lesion changes Psych: anxiety and depression  PE Blood pressure 124/64, pulse (!) 59, temperature 98.8 F (37.1 C), temperature source Oral, resp. rate 18, height 5\' 3"  (1.6 m), weight 85.3 kg, last menstrual period 10/05/2017, SpO2 95 %. Constitutional: NAD; conversant; no deformities Eyes: Moist conjunctiva; no lid lag; anicteric; PERRL Neck: Trachea midline; no thyromegaly Lungs: Normal respiratory effort; no tactile fremitus CV: RRR; no palpable thrills; no pitting edema GI: Abd tenderness with guarding in the right upper quadrant.  Otherwise soft; no palpable hepatosplenomegaly MSK: Normal range of motion of extremities; no clubbing/cyanosis Psychiatric: Appropriate affect; alert and oriented x3 Lymphatic: No palpable cervical or axillary lymphadenopathy  Results for orders placed or performed during the hospital encounter of 09/09/21 (from the past 48 hour(s))  Urinalysis, Routine w reflex  microscopic Urine, Clean Catch     Status: Abnormal   Collection Time: 09/09/21  7:34 PM  Result Value Ref Range   Color, Urine YELLOW YELLOW   APPearance CLEAR CLEAR   Specific Gravity, Urine 1.018 1.005 -  1.030   pH 7.0 5.0 - 8.0   Glucose, UA NEGATIVE NEGATIVE mg/dL   Hgb urine dipstick NEGATIVE NEGATIVE   Bilirubin Urine NEGATIVE NEGATIVE   Ketones, ur 80 (A) NEGATIVE mg/dL   Protein, ur NEGATIVE NEGATIVE mg/dL   Nitrite NEGATIVE NEGATIVE   Leukocytes,Ua NEGATIVE NEGATIVE    Comment: Performed at [facility] [address], [place], [place] [address]  CBC with Differential     Status: Abnormal   Collection Time: 09/09/21  7:55 PM  Result Value Ref Range   WBC 12.7 (H) 4.0 - 10.5 K/uL   RBC 4.31 3.87 - 5.11 MIL/uL   Hemoglobin 12.7 12.0 - 15.0 g/dL   HCT 38.3 36.0 - 46.0 %   MCV 88.9 80.0 - 100.0 fL   MCH 29.5 26.0 - 34.0 pg   MCHC 33.2 30.0 - 36.0 g/dL   RDW 12.6 11.5 - 15.5 %   Platelets 343 150 - 400 K/uL   nRBC 0.0 0.0 - 0.2 %   Neutrophils Relative % 83 %   Neutro Abs 10.6 (H) 1.7 - 7.7 K/uL   Lymphocytes Relative 12 %   Lymphs Abs 1.5 0.7 - 4.0 K/uL   Monocytes Relative 4 %   Monocytes Absolute 0.5 0.1 - 1.0 K/uL   Eosinophils Relative 0 %   Eosinophils Absolute 0.0 0.0 - 0.5 K/uL   Basophils Relative 0 %   Basophils Absolute 0.0 0.0 - 0.1 K/uL   Immature Granulocytes 1 %   Abs Immature Granulocytes 0.06 0.00 - 0.07 K/uL    Comment: Performed at [facility], [place] [address] [place], [place] [address]  Comprehensive metabolic panel     Status: Abnormal   Collection Time: 09/09/21  7:55 PM  Result Value Ref Range   Sodium 137 135 - 145 mmol/L   Potassium 4.0 3.5 - 5.1 mmol/L   Chloride 104 98 - 111 mmol/L   CO2 26 22 - 32 mmol/L   Glucose, Bld 112 (H) 70 - 99 mg/dL    Comment: Glucose reference range applies only to samples taken after fasting for at least 8 hours.   BUN 7 6 - 20 mg/dL   Creatinine, Ser 0.55 0.44 -  1.00 mg/dL   Calcium 9.4 8.9 - 10.3 mg/dL   Total Protein 7.7 6.5 - 8.1 g/dL   Albumin 4.2 3.5 - 5.0 g/dL   AST 21 15 - 41 U/L   ALT 16 0 - 44 U/L   Alkaline Phosphatase 86 38 - 126 U/L   Total Bilirubin 0.4 0.3 - 1.2 mg/dL   GFR, Estimated >60 >60 mL/min    Comment: (NOTE) Calculated using the CKD-EPI Creatinine Equation (2021)    Anion gap 7 5 - 15    Comment: Performed at [facility], [place] [address], [place], [place] [address]  Lipase, blood     Status: None   Collection Time: 09/09/21  7:55 PM  Result Value Ref Range   Lipase 24 11 - 51 U/L    Comment: Performed at [facility], [place] [address], [place], [place] [address]  I-Stat Beta hCG blood, ED ([facility], [facility], [facility] only)     Status: None   Collection Time: 09/09/21  8:00 PM  Result Value Ref Range   I-stat hCG, quantitative <5.0 <5 mIU/mL   Comment 3            Comment:   GEST. AGE      CONC.  (  mIU/mL)   <=1 WEEK        5 - 50     2 WEEKS       50 - 500     3 WEEKS       100 - 10,000     4 WEEKS     1,000 - 30,000        FEMALE AND NON-PREGNANT FEMALE:     LESS THAN 5 mIU/mL   Resp Panel by RT-PCR (Flu A&B, Covid) Nasopharyngeal Swab     Status: None   Collection Time: 09/10/21  5:34 AM   Specimen: Nasopharyngeal Swab; Nasopharyngeal(NP) swabs in vial transport medium  Result Value Ref Range   SARS Coronavirus 2 by RT PCR NEGATIVE NEGATIVE    Comment: (NOTE) SARS-CoV-2 target nucleic acids are NOT DETECTED.  The SARS-CoV-2 RNA is generally detectable in upper respiratory specimens during the acute phase of infection. The lowest concentration of SARS-CoV-2 viral copies this assay can detect is 138 copies/mL. A negative result does not preclude SARS-Cov-2 infection and should not be used as the sole basis for treatment or other patient management decisions. A negative result may occur with  improper specimen collection/handling, submission of specimen other than nasopharyngeal swab,  presence of viral mutation(s) within the areas targeted by this assay, and inadequate number of viral copies(<138 copies/mL). A negative result must be combined with clinical observations, patient history, and epidemiological information. The expected result is Negative.  Fact Sheet for Patients:  EntrepreneurPulse.com.au  Fact Sheet for Healthcare Providers:  IncredibleEmployment.be  This test is no t yet approved or cleared by the [place] FDA and  has been authorized for detection and/or diagnosis of SARS-CoV-2 by FDA under an Emergency Use Authorization (EUA). This EUA will remain  in effect (meaning this test can be used) for the duration of the COVID-19 declaration under Section 564(b)(1) of the Act, 21 U.S.C.section 360bbb-3(b)(1), unless the authorization is terminated  or revoked sooner.       Influenza A by PCR NEGATIVE NEGATIVE   Influenza B by PCR NEGATIVE NEGATIVE    Comment: (NOTE) The Xpert Xpress SARS-CoV-2/FLU/RSV plus assay is intended as an aid in the diagnosis of influenza from Nasopharyngeal swab specimens and should not be used as a sole basis for treatment. Nasal washings and aspirates are unacceptable for Xpert Xpress SARS-CoV-2/FLU/RSV testing.  Fact Sheet for Patients: EntrepreneurPulse.com.au  Fact Sheet for Healthcare Providers: IncredibleEmployment.be  This test is not yet approved or cleared by the [place] FDA and has been authorized for detection and/or diagnosis of SARS-CoV-2 by FDA under an Emergency Use Authorization (EUA). This EUA will remain in effect (meaning this test can be used) for the duration of the COVID-19 declaration under Section 564(b)(1) of the Act, 21 U.S.C. section 360bbb-3(b)(1), unless the authorization is terminated or revoked.  Performed at [facility], [place] [address] [place], [place] [address]     US Abdomen  Limited  Result Date: 09/09/2021 CLINICAL DATA:  RUQ pain/ cholelithiasis hx EXAM: ULTRASOUND ABDOMEN LIMITED RIGHT UPPER QUADRANT COMPARISON:  Sound abdomen 10/05/2018 FINDINGS: Gallbladder: 3.3 cm calcified gallstone within the gallbladder lumen. Gallbladder wall findings suggestive adenomyomatosis. Otherwise no gallbladder wall thickening or pericholecystic fluid visualized. No sonographic Murphy sign noted by sonographer. Common bile duct: Diameter: 6 mm Liver: No focal lesion identified. Within normal limits in parenchymal echogenicity. Portal vein is patent on color Doppler imaging with normal direction of blood flow towards the liver. Other: None. IMPRESSION: Cholelithiasis and  adenomyomatosis. No findings of acute cholecystitis. Electronically Signed   By: Iven Finn M.D.   On: 09/09/2021 20:20     A/P: Brandi Crawford is an 55 y.o. female with symptomatic cholelithiasis and possible mild cholecystitis  I explained the diagnosis to the patient in detail.  Given her multiple attacks and her continued discomfort currently, remove the gallbladder is recommended.  I discussed the reasons for this with her.  We discussed ongoing problems without removal of the gallbladder.  I next discussed proceeding with a laparoscopic cholecystectomy.  I explained the procedure in detail.  We discussed the risk which includes but is not limited to bleeding, infection, bile duct injury, bile leak, the need to convert to an open procedure, cardiopulmonary issues, postoperative recovery, etc. If she does well, hopefully she will be able to be discharged home from the postoperative recovery area.  She agrees to proceed with surgery which will be scheduled. Coralie Keens, MD [facility] Use [email or web address] to contact on call provider

## 2021-09-10 NOTE — Anesthesia Postprocedure Evaluation (Signed)
Anesthesia Post Note  Patient: Brandi Crawford  Procedure(s) Performed: LAPAROSCOPIC CHOLECYSTECTOMY (Abdomen)     Patient location during evaluation: PACU Anesthesia Type: General Level of consciousness: awake and alert Pain management: pain level controlled Vital Signs Assessment: post-procedure vital signs reviewed and stable Respiratory status: spontaneous breathing, nonlabored ventilation, respiratory function stable and patient connected to nasal cannula oxygen Cardiovascular status: blood pressure returned to baseline and stable Postop Assessment: no apparent nausea or vomiting Anesthetic complications: no   No notable events documented.  Last Vitals:  Vitals:   09/10/21 1230 09/10/21 1245  BP: 116/65 110/66  Pulse: 71 (!) 57  Resp: 16   Temp:    SpO2: 100% 100%    Last Pain:  Vitals:   09/10/21 1245  TempSrc:   PainSc: 6                  Shelton Silvas

## 2021-09-10 NOTE — Anesthesia Procedure Notes (Signed)
Procedure Name: Intubation Date/Time: 09/10/2021 11:13 AM Performed by: Vanessa [place], CRNA Pre-anesthesia Checklist: Patient identified, Emergency Drugs available, Suction available and Patient being monitored Patient Re-evaluated:Patient Re-evaluated prior to induction Oxygen Delivery Method: Circle system utilized Preoxygenation: Pre-oxygenation with 100% oxygen Induction Type: IV induction Ventilation: Mask ventilation without difficulty Laryngoscope Size: 2 and Miller Grade View: Grade I Tube type: Oral Tube size: 7.0 mm Number of attempts: 1 Airway Equipment and Method: Stylet Placement Confirmation: ETT inserted through vocal cords under direct vision, positive ETCO2 and breath sounds checked- equal and bilateral Secured at: 21 cm Tube secured with: Tape Dental Injury: Teeth and Oropharynx as per pre-operative assessment

## 2021-09-10 NOTE — Telephone Encounter (Signed)
Referral to surgeon placed. 

## 2021-09-10 NOTE — Op Note (Signed)
Laparoscopic Cholecystectomy Procedure Note  Indications: This patient presents with symptomatic gallbladder disease and will undergo laparoscopic cholecystectomy.  Pre-operative Diagnosis: acute cholecystitis with gallstones  Post-operative Diagnosis: Same  Surgeon: Meylin Miyamoto   Assistants: Carl Best, PA  Anesthesia: General endotracheal anesthesia  ASA Class: 1  Procedure Details  The patient was seen again in the Holding Room. The risks, benefits, complications, treatment options, and expected outcomes were discussed with the patient. The possibilities of reaction to medication, pulmonary aspiration, perforation of viscus, bleeding, recurrent infection, finding a normal gallbladder, the need for additional procedures, failure to diagnose a condition, the possible need to convert to an open procedure, and creating a complication requiring transfusion or operation were discussed with the patient. The likelihood of improving the patient's symptoms with return to their baseline status is good.  The patient and/or family concurred with the proposed plan, giving informed consent. The site of surgery properly noted. The patient was taken to Operating Room, identified as Brandi Crawford and the procedure verified as Laparoscopic Cholecystectomy with Intraoperative Cholangiogram. A Time Out was held and the above information confirmed.  Prior to the induction of general anesthesia, antibiotic prophylaxis was administered. General endotracheal anesthesia was then administered and tolerated well. After the induction, the abdomen was prepped with Chloraprep and draped in sterile fashion. The patient was positioned in the supine position.  Local anesthetic agent was injected into the skin near the umbilicus and an incision made. We dissected down to the abdominal fascia with blunt dissection.  The fascia was incised vertically and we entered the peritoneal cavity bluntly.  A pursestring suture  of 0-Vicryl was placed around the fascial opening.  The Hasson cannula was inserted and secured with the stay suture.  Pneumoperitoneum was then created with CO2 and tolerated well without any adverse changes in the patient's vital signs. A 5-mm port was placed in the subxiphoid position.  Two 5-mm ports were placed in the right upper quadrant. All skin incisions were infiltrated with a local anesthetic agent before making the incision and placing the trocars.   We positioned the patient in reverse Trendelenburg, tilted slightly to the patient's left.  The gallbladder was identified and found to be acutely inflamed.  It was very distended and I had to aspirate bile from the gallbladder in order to grasp it.  the fundus was grasped and retracted cephalad. Adhesions were lysed bluntly and with the electrocautery where indicated, taking care not to injure any adjacent organs or viscus. The infundibulum was grasped and retracted laterally, exposing the peritoneum overlying the triangle of Calot. This was then divided and exposed in a blunt fashion. The cystic duct was clearly identified and bluntly dissected circumferentially. A critical view of the cystic duct and cystic artery was obtained.  The cystic duct was then ligated with clips and divided. The cystic artery was, dissected free, ligated with clips and divided as well.   The gallbladder was dissected from the liver bed in retrograde fashion with the electrocautery. The gallbladder was removed and placed in an Endocatch sac. The liver bed was irrigated and inspected. Hemostasis was achieved with the electrocautery. Copious irrigation was utilized and was repeatedly aspirated until clear.  The gallbladder and Endocatch sac were then removed through the umbilical port site.  The pursestring suture was used to close the umbilical fascia.    We again inspected the right upper quadrant for hemostasis.  Pneumoperitoneum was released as we removed the trocars.   4-0 Monocryl was used to  close the skin.   Skin glue was then applied.  The patient was then extubated and brought to the recovery room in stable condition. Instrument, sponge, and needle counts were correct at closure and at the conclusion of the case.   Findings: Cholecystitis with Cholelithiasis  Estimated Blood Loss: Minimal         Drains: 0         Specimens: Gallbladder           Complications: None; patient tolerated the procedure well.         Disposition: PACU - hemodynamically stable.         Condition: stable

## 2021-09-11 ENCOUNTER — Encounter (HOSPITAL_COMMUNITY): Payer: Self-pay | Admitting: Surgery

## 2021-09-11 LAB — SURGICAL PATHOLOGY

## 2022-01-02 ENCOUNTER — Telehealth: Payer: Self-pay

## 2022-01-02 ENCOUNTER — Ambulatory Visit (INDEPENDENT_AMBULATORY_CARE_PROVIDER_SITE_OTHER): Payer: 59 | Admitting: Medical

## 2022-01-02 VITALS — BP 105/46 | HR 67 | Resp 18 | Ht 63.0 in | Wt 172.0 lb

## 2022-01-02 DIAGNOSIS — R739 Hyperglycemia, unspecified: Secondary | ICD-10-CM | POA: Diagnosis not present

## 2022-01-02 DIAGNOSIS — N950 Postmenopausal bleeding: Secondary | ICD-10-CM | POA: Diagnosis not present

## 2022-01-02 DIAGNOSIS — R5383 Other fatigue: Secondary | ICD-10-CM

## 2022-01-02 LAB — COMPREHENSIVE METABOLIC PANEL
ALT: 12 U/L (ref 0–35)
AST: 17 U/L (ref 0–37)
Albumin: 4.2 g/dL (ref 3.5–5.2)
Alkaline Phosphatase: 78 U/L (ref 39–117)
BUN: 10 mg/dL (ref 6–23)
CO2: 32 mEq/L (ref 19–32)
Calcium: 9.5 mg/dL (ref 8.4–10.5)
Chloride: 104 mEq/L (ref 96–112)
Creatinine, Ser: 0.71 mg/dL (ref 0.40–1.20)
GFR: 97.08 mL/min (ref >60.00)
Glucose, Bld: 91 mg/dL (ref 70–99)
Potassium: 4.5 mEq/L (ref 3.5–5.1)
Sodium: 140 mEq/L (ref 135–145)
Total Bilirubin: 0.5 mg/dL (ref 0.2–1.2)
Total Protein: 7 g/dL (ref 6.0–8.3)

## 2022-01-02 LAB — TSH: TSH: 0.94 u[IU]/mL (ref 0.35–5.50)

## 2022-01-02 LAB — T4, FREE: Free T4: 0.74 ng/dL (ref 0.60–1.60)

## 2022-01-02 LAB — HEMOGLOBIN A1C: Hgb A1c MFr Bld: 5.2 % (ref 4.6–6.5)

## 2022-01-02 LAB — VITAMIN B12: Vitamin B-12: 529 pg/mL (ref 211–911)

## 2022-01-02 NOTE — Telephone Encounter (Signed)
Opened to print letter

## 2022-01-02 NOTE — Patient Instructions (Addendum)
Hx of gallstones with recent surgery to remove in November. No fatty liver, normal abd exam/no liver enlargement that I can feel and your eyes do not look jaundiced presently. Skin looks normal and norml liver enzymes.  Repeat cmp today. If any lft elevation will order comprehensive hepatitis panel.   For described post menopausal bleeding placed referral to gynecologist at [facility]. May need repeat endometrial biopsy.  For sugar elevation will get a1c.  For fatigue b12, b1, tsh, t4 and vid d.  Follow up date to be determined after lab review.

## 2022-01-02 NOTE — Addendum Note (Signed)
Addended by: Anabel Halon on: 01/02/2022 10:03 AM   Modules accepted: Orders

## 2022-01-02 NOTE — Progress Notes (Addendum)
Subjective:    Patient ID: Brandi Crawford, female    DOB: Jul 18, 1966, 56 y.o.   MRN: [id number]  HPI Pt in for follow up.  Pt has hx of gallstones. Pt had surgery 09-10-2021.  Pt states no longer has any ruq pain.  Pt daughter this her eyes are yellow color. In November liver enzymes were normal. Prior US did not mention fatty liver.  Back when pt had severe pain prior to surgery is when daughter thought eyes were yellow.  LMP- pt states 3 years since last menses. Pt has gyn. Pt had endometrial bx in past and pap. Had these studies done 1.5 years  Recently 3 days of mild bleeding.    Review of Systems  Constitutional:  Positive for fatigue. Negative for chills and fever.  Eyes:  Negative for visual disturbance.  Respiratory:  Negative for cough, chest tightness and shortness of breath.   Cardiovascular:  Negative for chest pain and palpitations.  Gastrointestinal:  Negative for abdominal pain, blood in stool, constipation, diarrhea and nausea.  Genitourinary:  Negative for dysuria.  Neurological:  Negative for dizziness and headaches.    No past medical history on file.   Social History   Socioeconomic History   Marital status: Married    Spouse name: Not on file   Number of children: Not on file   Years of education: Not on file   Highest education level: Not on file  Occupational History   Not on file  Tobacco Use   Smoking status: Never   Smokeless tobacco: Never  Substance and Sexual Activity   Alcohol use: Not Currently   Drug use: Not Currently   Sexual activity: Yes  Other Topics Concern   Not on file  Social History Narrative   Not on file   Social Determinants of Health   Financial Resource Strain: Not on file  Food Insecurity: Not on file  Transportation Needs: Not on file  Physical Activity: Not on file  Stress: Not on file  Social Connections: Not on file  Intimate Partner Violence: Not on file    Past Surgical History:  Procedure  Laterality Date   CARPAL TUNNEL RELEASE Left    CHOLECYSTECTOMY N/A 09/10/2021   Procedure: LAPAROSCOPIC CHOLECYSTECTOMY;  Surgeon: Coralie Keens, MD;  Location: [facility];  Service: General;  Laterality: N/A;    No family history on file.  Allergies  Allergen Reactions   Contrast Media [Iodinated Contrast Media] Itching    Current Outpatient Medications on File Prior to Visit  Medication Sig Dispense Refill   Multiple Vitamin (MULTIVITAMIN WITH MINERALS) TABS tablet Take 1 tablet by mouth daily.     oxyCODONE (OXY IR/ROXICODONE) 5 MG immediate release tablet Take 1 tablet (5 mg total) by mouth every 6 (six) hours as needed for moderate pain or severe pain. 25 tablet 0   dicyclomine (BENTYL) 20 MG tablet Take 1 tablet (20 mg total) by mouth 2 (two) times daily for 14 days. 28 tablet 0   No current facility-administered medications on file prior to visit.    BP (!) 105/46    Pulse 67    Resp 18    Ht 5\' 3"  (1.6 m)    Wt 172 lb (78 kg)    LMP 10/05/2017    SpO2 100%    BMI 30.47 kg/m       Objective:   Physical Exam  General- No acute distress. Pleasant patient. Neck- Full range of motion, no jvd Lungs- Clear, even  and unlabored. Heart- regular rate and rhythm. Neurologic- CNII- XII grossly intact.   Abd-soft, nt, nd, +bs, no organomelly. No adenexal area pain as well.  Back - no cva pain.  Eyes and skin- normal/no jaundiced appearance.    Assessment & Plan:   Patient Instructions  Hx of gallstones with recent surgery to remove in November. No fatty liver, normal abd exam/no liver enlargement that I can feel and your eyes do not look jaundiced presently. Skin looks normal and norml liver enzymes.  Repeat cmp today. If any lft elevation will order comprehensive hepatitis panel.   For described post menopausal bleeding placed referral to gynecologist at [facility], [place]. May need repeat endometrial biopsy.  For sugar elevation will get a1c.  For fatigue b12, b1, tsh, t4 and  vid d.  Follow up date to be determined after lab review.   Mackie Pai, PA-C

## 2022-01-06 LAB — VITAMIN D 1,25 DIHYDROXY
Vitamin D 1, 25 (OH)2 Total: 32 pg/mL (ref 18–72)
Vitamin D2 1, 25 (OH)2: <8 pg/mL
Vitamin D3 1, 25 (OH)2: 32 pg/mL

## 2022-01-06 LAB — VITAMIN B1: Vitamin B1 (Thiamine): 12 nmol/L (ref 8–30)

## 2022-06-18 ENCOUNTER — Telehealth (HOSPITAL_BASED_OUTPATIENT_CLINIC_OR_DEPARTMENT_OTHER): Payer: Self-pay

## 2023-04-16 IMAGING — US US ABDOMEN LIMITED
1 series · 15 of 25 positions shown · non-contrast
Comparison: Sound abdomen 10/05/2018

CLINICAL DATA: RUQ pain/ cholelithiasis hx

EXAM:
ULTRASOUND ABDOMEN LIMITED RIGHT UPPER QUADRANT

[Series 1: us abdomen limited mc & wl · 15 of 49 slices shown]
[im 1/49]
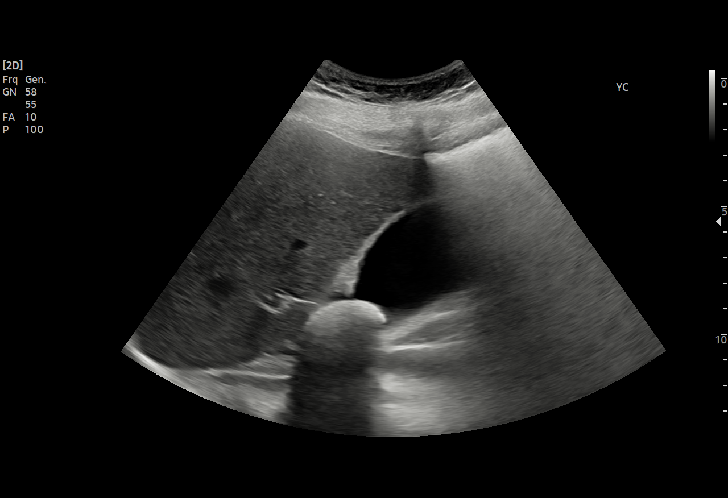
[im 5/49]
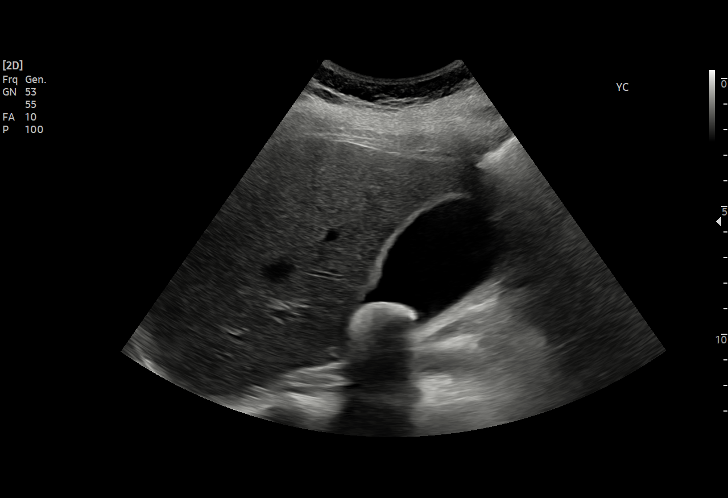
[im 9/49]
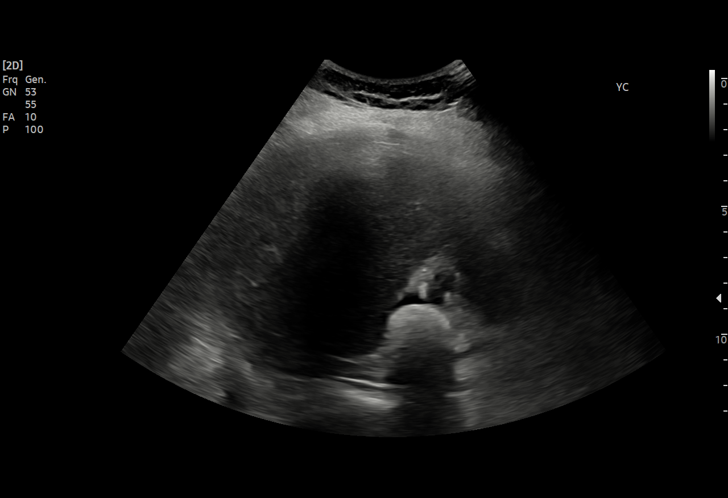
[im 11/49]
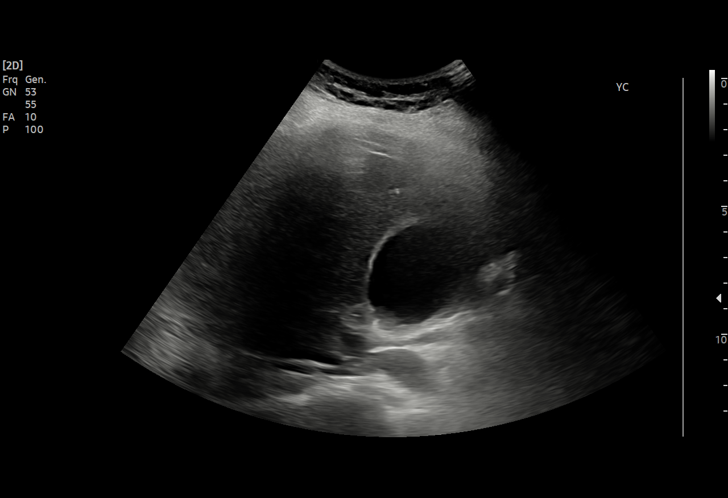
[im 15/49]
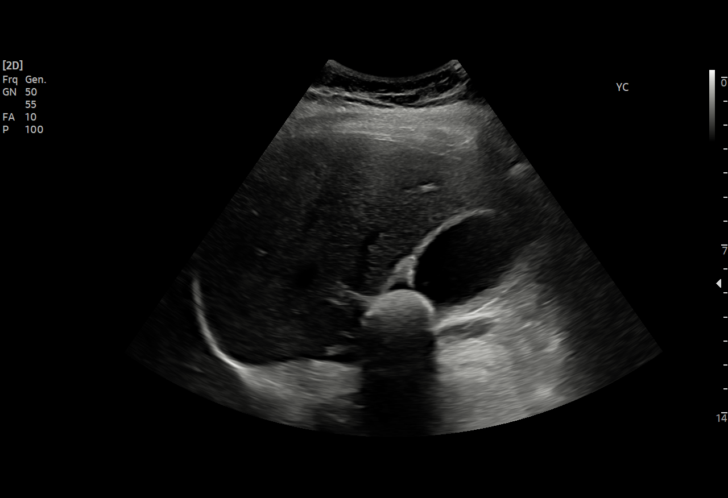
[im 19/49]
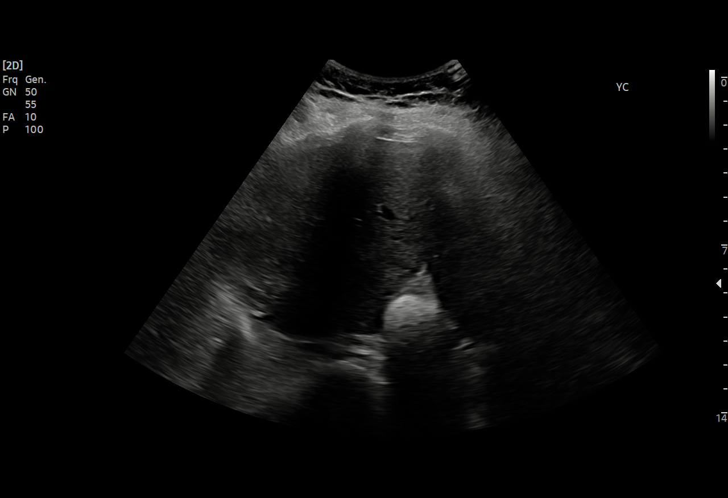
[im 21/49]
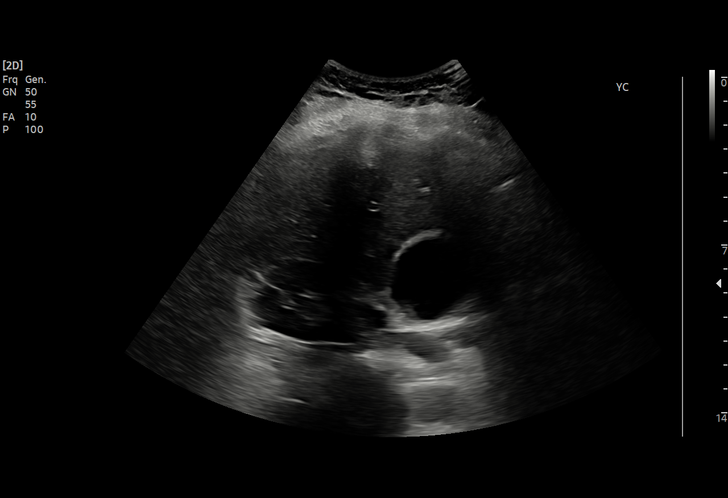
[im 25/49]
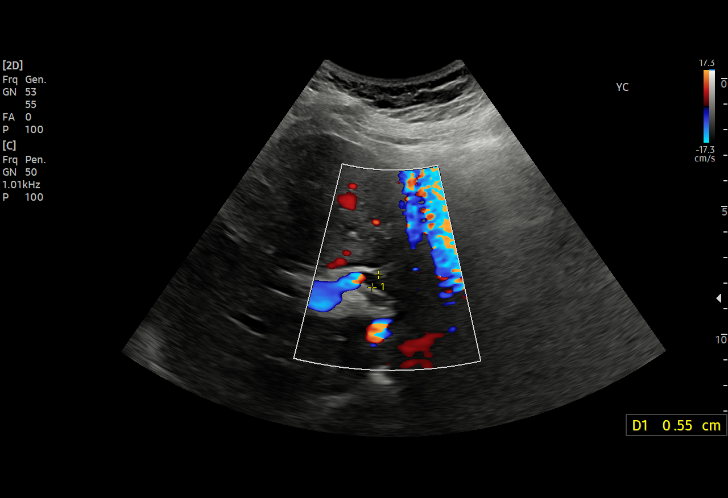
[im 29/49]
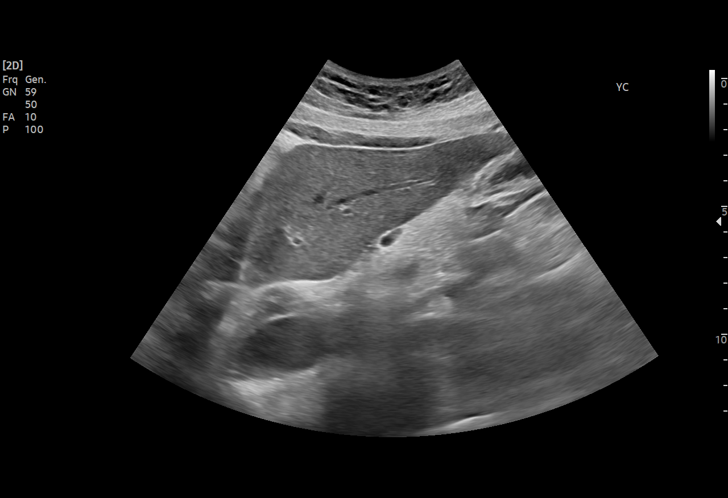
[im 31/49]
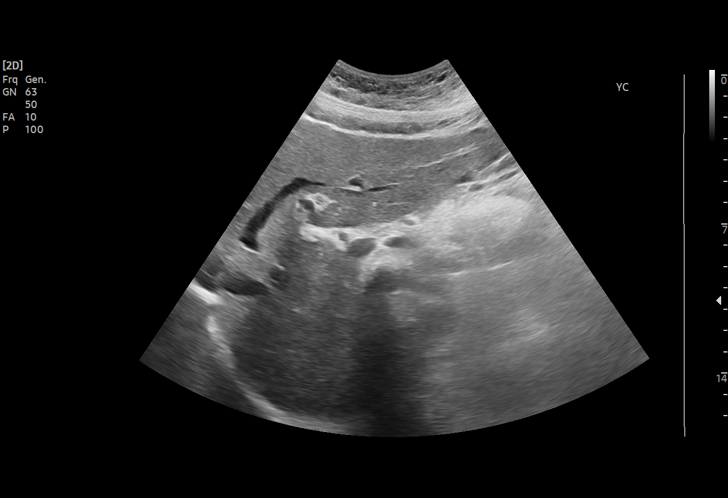
[im 35/49]
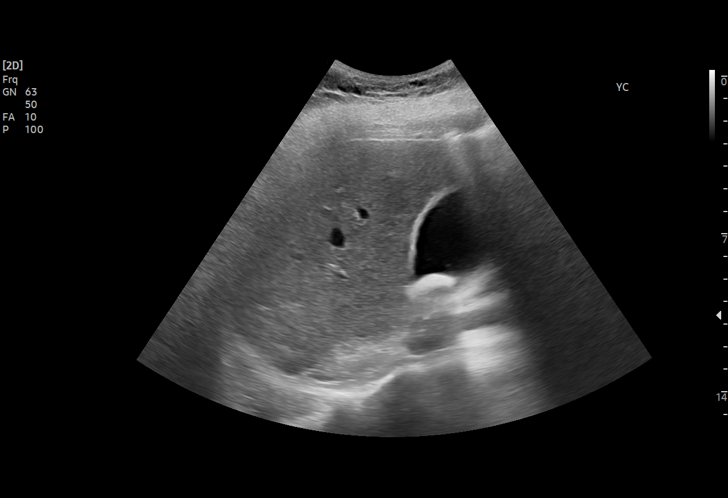
[im 39/49]
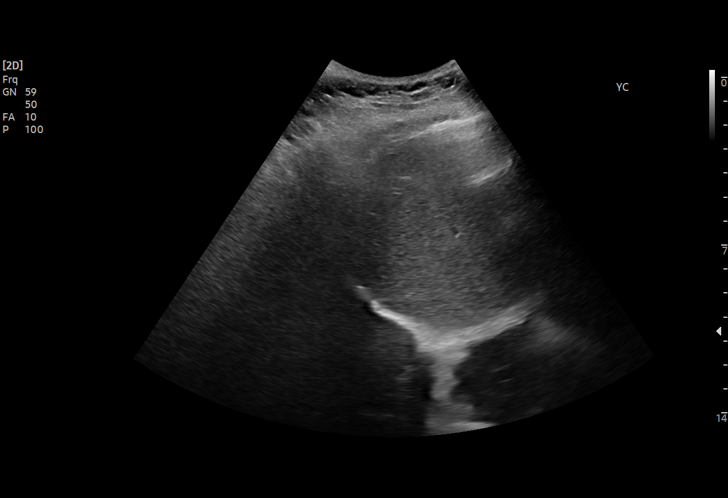
[im 41/49]
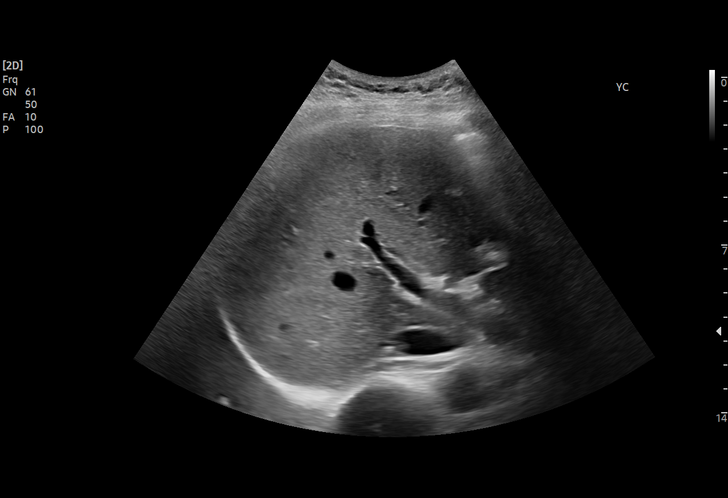
[im 45/49]
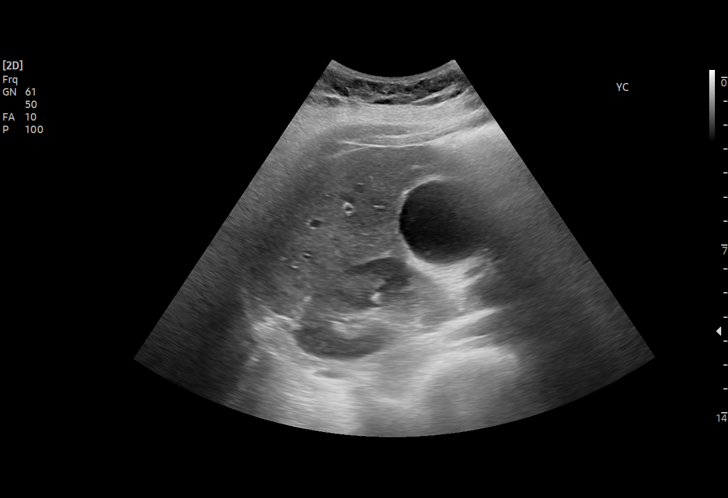
[im 49/49]
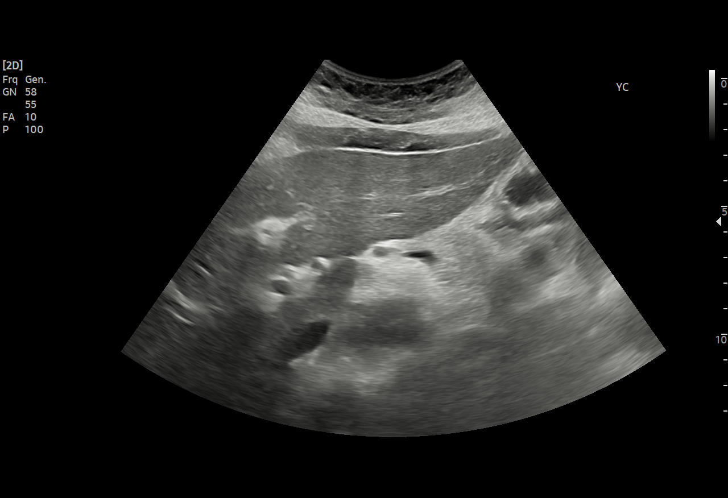

[15 of 25 positions shown; findings below may reference images not displayed]

FINDINGS: Gallbladder:

3.3 cm calcified gallstone within the gallbladder lumen. Gallbladder
wall findings suggestive adenomyomatosis. Otherwise no gallbladder
wall thickening or pericholecystic fluid visualized. No sonographic
Murphy sign noted by sonographer.

Common bile duct:

Diameter: 6 mm

Liver:

No focal lesion identified. Within normal limits in parenchymal
echogenicity. Portal vein is patent on color Doppler imaging with
normal direction of blood flow towards the liver.

Other: None.
IMPRESSION: Cholelithiasis and adenomyomatosis. No findings of acute
cholecystitis.
